# Patient Record
Sex: Female | Born: 1950
Health system: Southern US, Community
[De-identification: ages and names within clinical notes are randomized; demographics above are authoritative.]

## PROBLEM LIST (undated history)

## (undated) DIAGNOSIS — Q859 Phakomatosis, unspecified: Secondary | ICD-10-CM

## (undated) DIAGNOSIS — R740 Nonspecific elevation of levels of transaminase and lactic acid dehydrogenase [LDH]: Secondary | ICD-10-CM

## (undated) DIAGNOSIS — M8580 Other specified disorders of bone density and structure, unspecified site: Secondary | ICD-10-CM

## (undated) DIAGNOSIS — Z5189 Encounter for other specified aftercare: Secondary | ICD-10-CM

## (undated) DIAGNOSIS — K7581 Nonalcoholic steatohepatitis (NASH): Secondary | ICD-10-CM

## (undated) DIAGNOSIS — H269 Unspecified cataract: Secondary | ICD-10-CM

## (undated) DIAGNOSIS — R7989 Other specified abnormal findings of blood chemistry: Secondary | ICD-10-CM

## (undated) DIAGNOSIS — R945 Abnormal results of liver function studies: Secondary | ICD-10-CM

## (undated) DIAGNOSIS — R7303 Prediabetes: Secondary | ICD-10-CM

## (undated) DIAGNOSIS — K8020 Calculus of gallbladder without cholecystitis without obstruction: Secondary | ICD-10-CM

## (undated) DIAGNOSIS — K5790 Diverticulosis of intestine, part unspecified, without perforation or abscess without bleeding: Secondary | ICD-10-CM

## (undated) HISTORY — DX: Abnormal results of liver function studies: R94.5

## (undated) HISTORY — DX: Diverticulosis of intestine, part unspecified, without perforation or abscess without bleeding: K57.90

## (undated) HISTORY — PX: TONSILLECTOMY: SHX5217

## (undated) HISTORY — DX: Prediabetes: R73.03

## (undated) HISTORY — DX: Unspecified cataract: H26.9

## (undated) HISTORY — PX: OOPHORECTOMY: SHX86

## (undated) HISTORY — PX: OTHER SURGICAL HISTORY: SHX169

## (undated) HISTORY — DX: Nonalcoholic steatohepatitis (NASH): K75.81

## (undated) HISTORY — DX: Calculus of gallbladder without cholecystitis without obstruction: K80.20

## (undated) HISTORY — DX: Other specified disorders of bone density and structure, unspecified site: M85.80

## (undated) HISTORY — DX: Encounter for other specified aftercare: Z51.89

## (undated) HISTORY — PX: COLONOSCOPY: SHX174

## (undated) HISTORY — PX: TUBAL LIGATION: SHX77

## (undated) HISTORY — DX: Other specified abnormal findings of blood chemistry: R79.89

## (undated) HISTORY — PX: SALPINGECTOMY: SHX328

## (undated) HISTORY — DX: Nonspecific elevation of levels of transaminase and lactic acid dehydrogenase (ldh): R74.0

---

## 1997-05-10 ENCOUNTER — Other Ambulatory Visit: Admission: RE | Admit: 1997-05-10 | Discharge: 1997-05-10 | Payer: Self-pay | Admitting: Gynecology

## 1998-03-24 ENCOUNTER — Other Ambulatory Visit: Admission: RE | Admit: 1998-03-24 | Discharge: 1998-03-24 | Payer: Self-pay | Admitting: Gynecology

## 2002-01-19 ENCOUNTER — Other Ambulatory Visit: Admission: RE | Admit: 2002-01-19 | Discharge: 2002-01-19 | Payer: Self-pay | Admitting: Obstetrics and Gynecology

## 2003-02-25 ENCOUNTER — Other Ambulatory Visit: Admission: RE | Admit: 2003-02-25 | Discharge: 2003-02-25 | Payer: Self-pay | Admitting: Gynecology

## 2003-03-12 HISTORY — PX: VAGINAL HYSTERECTOMY: SUR661

## 2003-03-22 ENCOUNTER — Observation Stay (HOSPITAL_COMMUNITY): Admission: RE | Admit: 2003-03-22 | Discharge: 2003-03-23 | Payer: Self-pay | Admitting: Obstetrics and Gynecology

## 2003-03-22 ENCOUNTER — Encounter (INDEPENDENT_AMBULATORY_CARE_PROVIDER_SITE_OTHER): Payer: Self-pay | Admitting: Specialist

## 2003-03-22 ENCOUNTER — Encounter (INDEPENDENT_AMBULATORY_CARE_PROVIDER_SITE_OTHER): Payer: Self-pay | Admitting: *Deleted

## 2004-02-06 ENCOUNTER — Ambulatory Visit: Payer: Self-pay | Admitting: Internal Medicine

## 2004-03-23 ENCOUNTER — Other Ambulatory Visit: Admission: RE | Admit: 2004-03-23 | Discharge: 2004-03-23 | Payer: Self-pay | Admitting: Obstetrics and Gynecology

## 2005-03-31 ENCOUNTER — Other Ambulatory Visit: Admission: RE | Admit: 2005-03-31 | Discharge: 2005-03-31 | Payer: Self-pay | Admitting: Obstetrics and Gynecology

## 2005-04-11 ENCOUNTER — Encounter: Payer: Self-pay | Admitting: Internal Medicine

## 2005-04-11 LAB — CONVERTED CEMR LAB

## 2005-05-03 ENCOUNTER — Ambulatory Visit: Payer: Self-pay | Admitting: Internal Medicine

## 2006-08-29 ENCOUNTER — Other Ambulatory Visit: Admission: RE | Admit: 2006-08-29 | Discharge: 2006-08-29 | Payer: Self-pay | Admitting: Obstetrics and Gynecology

## 2006-10-13 ENCOUNTER — Ambulatory Visit: Payer: Self-pay | Admitting: Internal Medicine

## 2006-10-13 DIAGNOSIS — E782 Mixed hyperlipidemia: Secondary | ICD-10-CM | POA: Insufficient documentation

## 2006-10-13 DIAGNOSIS — R9431 Abnormal electrocardiogram [ECG] [EKG]: Secondary | ICD-10-CM | POA: Insufficient documentation

## 2006-10-13 DIAGNOSIS — R002 Palpitations: Secondary | ICD-10-CM | POA: Insufficient documentation

## 2006-10-18 ENCOUNTER — Encounter (INDEPENDENT_AMBULATORY_CARE_PROVIDER_SITE_OTHER): Payer: Self-pay | Admitting: *Deleted

## 2006-10-26 ENCOUNTER — Ambulatory Visit: Payer: Self-pay | Admitting: Internal Medicine

## 2007-01-17 ENCOUNTER — Ambulatory Visit: Payer: Self-pay | Admitting: Internal Medicine

## 2007-01-31 ENCOUNTER — Encounter: Payer: Self-pay | Admitting: Internal Medicine

## 2007-01-31 ENCOUNTER — Ambulatory Visit: Payer: Self-pay | Admitting: Internal Medicine

## 2007-02-02 ENCOUNTER — Encounter: Payer: Self-pay | Admitting: Internal Medicine

## 2007-11-07 ENCOUNTER — Ambulatory Visit: Payer: Self-pay | Admitting: Cardiology

## 2007-11-07 ENCOUNTER — Telehealth (INDEPENDENT_AMBULATORY_CARE_PROVIDER_SITE_OTHER): Payer: Self-pay | Admitting: *Deleted

## 2007-11-07 ENCOUNTER — Observation Stay (HOSPITAL_COMMUNITY): Admission: EM | Admit: 2007-11-07 | Discharge: 2007-11-08 | Payer: Self-pay | Admitting: Emergency Medicine

## 2007-11-22 ENCOUNTER — Ambulatory Visit: Payer: Self-pay

## 2007-11-22 ENCOUNTER — Encounter: Payer: Self-pay | Admitting: Internal Medicine

## 2007-12-05 ENCOUNTER — Encounter: Payer: Self-pay | Admitting: Cardiology

## 2007-12-05 ENCOUNTER — Ambulatory Visit: Payer: Self-pay

## 2007-12-05 ENCOUNTER — Ambulatory Visit: Payer: Self-pay | Admitting: Cardiology

## 2007-12-13 DIAGNOSIS — I059 Rheumatic mitral valve disease, unspecified: Secondary | ICD-10-CM | POA: Insufficient documentation

## 2008-10-21 ENCOUNTER — Ambulatory Visit: Payer: Self-pay | Admitting: Internal Medicine

## 2008-11-11 ENCOUNTER — Ambulatory Visit: Payer: Self-pay | Admitting: Cardiology

## 2008-11-18 LAB — CONVERTED CEMR LAB
HDL: 39.4 mg/dL (ref >39.00)
Total CHOL/HDL Ratio: 4

## 2010-02-08 LAB — CONVERTED CEMR LAB
Free T4: 0.8 ng/dL (ref 0.6–1.6)
Hgb A1c MFr Bld: 5.9 % (ref 4.6–6.0)
LDL Cholesterol: 114 mg/dL — ABNORMAL HIGH (ref 0–99)
Triglycerides: 112 mg/dL (ref 0–149)
VLDL: 22 mg/dL (ref 0–40)

## 2010-05-26 NOTE — Assessment & Plan Note (Signed)
[facility]                            CARDIOLOGY OFFICE NOTE   Stacy Schultz, Stacy Schultz                          MRN:          [id number]  DATE:12/05/2007                            DOB:          [birth year]    PRIMARY CARE PHYSICIAN:  Titus Dubin. Hopper, MD,FACP,FCCP   HISTORY OF PRESENT ILLNESS:  This is a 60 year old with a history of  asbestos exposure, palpitations, and mild dyslipidemia who presents to  Cardiology Clinic for followup after an admission in October for a chest  pain.  The patient was admitted on November 07, 2007, with substernal  chest tightness.  Her cardiac enzymes were negative x3.  D-dimer was  negative.  The patient did have a CT of her chest at that time because  of an abnormality noted on chest x-ray.  This showed bilateral pleural  plaques consistent with asbestos effect as well as a 5-mm lung nodule  and a medial breast nodule.  Since discharge from the hospital, the  patient has had an echocardiogram that showed normal LV function as well  as mitral valve prolapse without significant mitral regurgitation and  exercise Myoview on which she exercised 5 minutes with the EF of 74% and  no perfusion abnormalities.  She additionally had a mammogram and the  breast nodule was noted to be a fluid-filled cyst that was drained.  Since discharge, the patient has had no further chest pain.  She  continues to have episodes of palpitations.  She states that she has had  palpitations since her 17s.  She will have a fast heart rate and  occasionally feel that a beat is skipped.  These episodes occur 1-2  times a week, usually with rest, never with exertion.  She has never had  an episode of syncope or lightheadedness.  She actually does take  propranolol p.r.n. for these palpitation episodes.   MEDICATIONS:  1. Aspirin 81 mg daily.  2. Propranolol p.r.n. palpitations.   PAST MEDICAL HISTORY:  1. Mitral valve prolapse.  The patient had an  echocardiogram done on      December 05, 2007, showing EF of 60%.  There was mitral valve      prolapse without significant mitral regurgitation.  The RV appeared      normal.  2. Mild dyslipidemia.  LDL was 119 in October 2009.  3. Remote ectopic pregnancy.  4. History of  asbestos exposure with pleural plaquing.  5. History of palpitations.  The patient takes propranolol when she      gets these.   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  She lives here in [place].  She has 5  children, all of them are out of the house now.  She does not smoke.  She rarely drinks alcohol.  She does not use any illicit drugs.   FAMILY HISTORY:  The patient's father died of mesothelioma at the age of  52 from asbestos exposure.  The patient's sister had a myocardial  infarction at the age of  58, and the patient's mother has a pacemaker.   REVIEW OF SYSTEMS:  Negative except as noted in the history of present  illness.   LABORATORY DATA:  Most recent from October 2009, HDL 37 and LDL 119.   PHYSICAL EXAMINATION:  VITAL SIGNS:  Blood pressure 120/70, heart rate  is 70 and regular, and weight is 141 pounds.  GENERAL:  This is a well-developed female in no apparent distress.  NEUROLOGIC:  Alert and oriented x3.  Normal affect.  NECK:  There is no JVD.  There is no thyromegaly or thyroid nodule.  LUNGS:  Clear to auscultation bilaterally with normal respiratory  effort.  CARDIOVASCULAR:  Heart regular, S1 and S2.  There is no S3 or S4.  There  is a midsystolic click consistent with mitral valve prolapse.  There is  no murmur.  There is no peripheral edema.  There are 2+ posterior tibial  pulses bilaterally.  There is no carotid bruit.  ABDOMEN:  Soft and nontender.  No  hepatosplenomegaly.  Normal bowel  sounds.  EXTREMITIES:  No clubbing or cyanosis.  MUSCULOSKELETAL:  Normal exam.  HEENT:  Normal exam.  SKIN:  Normal exam.    ASSESSMENT AND PLAN:  This is a 60 year old with mitral valve prolapse  who presents for followup after a recent episode of chest pain.  1. Chest pain.  The patient has had no further episodes.  She did have      negative cardiac enzymes and negative D-dimer.  Exercise Myoview      showed no evidence of ischemia.  Echocardiogram showed normal left      ventricular function.  I do think this is probably non-cardiac      chest pain.  The patient will continue on her aspirin 81 mg a day.      The main benefit from thisfor her probably will be decreased risk      of stroke.  2. Mitral valve prolapse.  The patient does have mitral valve prolapse      noted on echocardiogram done today.  There is no significant mitral      regurgitation.  We will continue to follow this in the future.  She      does not need antibiotic prophylaxis with dental procedures.  We      will have a followup in the office in about a year just to check on      her to see if there is any murmur of mitral regurgitation or any      decrease in her exercise tolerance.  If she is asymptomatic with no      murmur, we will probably follow up an echo in 2-3 years.  3. Dyslipidemia.  The patient's HDL is a little low at 37.  Her LDL is      mildly elevated at 119.  We will continue to follow this and      encourage her to improve her diet and increase her exercise level.      I did tell the patient about trying to walk about 4-5 times a week.      Of note, she only went for 5 minutes on the treadmill with her      exercise Myoview.  However, she states that she was not tired and      they told her she could stop because her heart rate was high      enough.  4. Palpitations.  I will have the  patient do a 2-week event monitor.      She states that she does have the palpitations about 1-2 times a      week, so that we should be able to catch these episodes.  We will      see if she has any clinically relevant arrhythmia.  5. A  5-mm lung nodule.  We will follow up a CT noncontrast of her      chest in next October to make sure that the nodules are not      growing.     Marca Ancona, MD  Electronically Signed    DM/MedQ  DD: 12/05/2007  DT: 12/06/2007  Job #: [id number]   cc:   Titus Dubin. Alwyn Ren, MD,FACP,FCCP

## 2010-05-26 NOTE — Discharge Summary (Signed)
[name], Schultz.:  [id number]   MEDICAL RECORD NO.:  [id number]          PATIENT TYPE:  OBV   LOCATION:  2012                         FACILITY:  [facility]   PHYSICIAN:  Marca Ancona, MD      DATE OF BIRTH:  [birth year]   DATE OF ADMISSION:  11/07/2007  DATE OF DISCHARGE:  11/08/2007                               DISCHARGE SUMMARY   PROCEDURES:  CT of the chest without contrast.   PRIMARY FINAL DISCHARGE DIAGNOSES:  Chest pain, cardiac enzymes negative  for myocardial infarction and outpatient Myoview planned.   SECONDARY DIAGNOSIS:  1. Mitral valve prolapse, outpatient echocardiogram planned.  2. Partially calcified bilateral pleural plaques consistent with prior      asbestos exposure seen on chest x-ray and CT.  3. Lung nodules, measuring up to 5 mm, followup CT in 6-12 month.  4. Medial left breast nodule, recommend correlation with mammogram and      possibly ultrasound.  It is indeterminate by CT.  5. Fatty infiltration of the liver with 1.7 cm medial segment left      liver lobe lesion that is indeterminate.  If left breast workup is      benign, ultrasounds may confirm a simple cyst.  If left breast      workup is malignant, MRI is suggested.  6. Suspect at least one right-sided thyroid nodule, consider      ultrasound.  7. Remote history of ectopic pregnancy.  8. History of an myocardial in one sister secondary to a abnormal      vessel, no stent require.   TIME OF DISCHARGE:  32 minutes.   HOSPITAL COURSE:  [name] is a 60 year old female with no previous  history of coronary artery disease.  She had episodic sharp chest pain  that occurred with deep inspiration.  She came to the hospital and was  admitted for further evaluation.   Her cardiac enzymes were negative for MI.  A lipid profile showed a  total cholesterol 173, triglycerides 85, and HDL 37, LDL 119.  A heart-  healthy diet is recommended.  A D-dimer was also within normal limits  at  0.39.  She was not significantly anemic and had no abnormalities on a  basic metabolic panel.  A chest x-ray showed bilateral a regular  opacities in the mid lung and so a noncontrast CT was performed with  results described above.  Followup is indicated, which can be obtained  through her primary care physician.   On November 08, 2007, [name] was seen by Dr. Shirlee Latch.  Her symptoms had  resolved.  Her O2 saturation was 98% on room air.  She had no  significant arrhythmia.  Dr. Shirlee Latch evaluated [name] and felt she was  stable for discharge with outpatient stress testing and followup  arranged.   DISCHARGE INSTRUCTIONS:  Her activity level is to be increased  gradually.  She is encouraged to stick to a low-sodium heart-healthy  diet.  She is to get a mammogram to follow up the breast nodule and a  repeat CT scan of the chest in 6-12 months.  On November 22, 2007,  at  9:00 a.m., she is to have a stress test with no food or drink after  midnight and no caffeine for 24 hours before.  On November 28, 2007, at  10:30, she is to come to our office for an echocardiogram and see Dr.  Shirlee Latch at 11:30.   DISCHARGE MEDICATIONS:  1. Advil p.r.n.  2. Aspirin 81 mg a day.  3. Propranolol 40 mg p.r.n.      Theodore Demark, PA-C      Marca Ancona, MD  Electronically Signed    RB/MEDQ  D:  11/08/2007  T:  11/08/2007  Job:  [id number]   cc:   Titus Dubin. Alwyn Ren, MD,FACP,FCCP

## 2010-05-26 NOTE — H&P (Signed)
NAMEREDA, CITRON.:  [id number]   MEDICAL RECORD NO.:  [id number]          PATIENT TYPE:  OBV   LOCATION:  2012                         FACILITY:  [facility]   PHYSICIAN:  Marca Ancona, MD      DATE OF BIRTH:  26-Jan-1950   DATE OF ADMISSION:  11/07/2007  DATE OF DISCHARGE:                              HISTORY & PHYSICAL   PRIMARY CARE PHYSICIAN:  Dr. Alwyn Ren.   PRIMARY CARDIOLOGIST:  Marca Ancona, MD   CHIEF COMPLAINT:  Chest pain.   HISTORY OF PRESENT ILLNESS:  [name] is a 60 year old female with no  previous history of coronary artery disease.  For years, she has been  having rare episodes of abdominal tightness that radiated up into her  chest and are associated with palpitations, headache, and fatigue.  She  has always attributed these episodes to her mitral valve prolapse and  they are relieved with p.r.n. medication.  They only have an about twice  a year and she has not had any recently.   Thee days ago, she had episodic chest pain.  It was not exertional.  It  was sharp and each episode would last approximately 2 minutes.  The  intensity was a 4/10.  She had 3 or 4 episodes, but they finally  resolved and she rested well.  Day before yesterday, she had no  symptoms.  Yesterday, she had a chest pain that was not quite as sharp  as the previous chest pain.  It occurred with deep inspiration only.  With each deep breath, she would have this pain which would then  resolve.  She had these all day long, but they finally resolved.  She  had no palpitations or other symptoms.  She rested well.  Today, she had  a headache and some palpitations, but not the other symptoms she has  previously associated with mitral valve prolapse.  She once again  experienced chest pain with deep inspiration, which was recurrent all  morning.  She describes it as a more dull pain and states it reached to  2 or 3/10.  She contacted Dr. Frederik Pear office and they requested  that  she come to the emergency room.  In the emergency room, she is resting  comfortably.  She states her symptoms resolved about 1 p.m. this  afternoon.   PAST MEDICAL HISTORY:  1. Mild dyslipidemia with a total cholesterol of 173, triglycerides      112, HDL 36.9, LDL 114 as of October 2008.  2. History of mitral valve prolapse with no recent echocardiogram.  3. Remote history of stress test reportedly normal, 8-10 years ago.  4. Remote history of ectopic pregnancy.  5. She has no history of diabetes, hypertension, tobacco use, or      obesity.   SURGICAL HISTORY:  She is status post lipoma removal as well as  colonoscopy, tonsillectomy, vaginal hysterectomy with BSO or benign  ovarian tumor.   ALLERGIES:  No known drug allergies.   CURRENT MEDICATIONS:  No prescription medications, only an occasional  Advil.  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  She  has no history of alcohol, tobacco or drug abuse, and does not exercise,  but feels that she eats a healthy diet.   FAMILY HISTORY:  Her mother is alive at age 23 and has a pacemaker, but  no coronary artery disease.  Her father died at age 67 of mesothelioma.  One of her sisters had an MI at age 61 and the patient states that her  sister did not need a stent, but was told she had an MI because of an  abnormality in a vessel in the back of her heart.   REVIEW OF SYSTEMS:  She has not had fevers, chills, or sweats.  She has  had no cough or cold symptoms.  She has had no GI symptoms or reflux.  She has rare arthralgias.  She denies depression or anxiety.  Full 14-  point review of systems is otherwise negative except for the symptoms  described in the HPI.   PHYSICAL EXAM:  VITAL SIGNS:  Temperature is 97.9, blood pressure  154/73, pulse 81, respiratory rate 12, and O2 saturation 98% on room  air.  GENERAL:  She is a  well-developed, well-nourished, white female in no  acute distress.  HEENT:  Normal.  NECK:  There is no lymphadenopathy, thyromegaly, bruit, or JVD noted.  CV:  Her heart is regular in rate and rhythm with an S1 and S2 and no S3  or S4.  There is no murmur or rub noted.  She has a mitral prolapse  click.  Distal pulses are 2+ and no femoral bruits are appreciated.  LUNGS:  Clear to auscultation bilaterally.  SKIN:  No rashes or lesions are noted.  ABDOMEN:  Soft and nontender with active bowel sounds.  No splenomegaly  is noted.  EXTREMITIES:  There is no cyanosis, clubbing, or edema noted.  MUSCULOSKELETAL:  There is no joint deformity or effusions and no spine  or CVA tenderness.  NEUROLOGIC:  She is alert and oriented.  Cranial nerves II through XII  grossly intact.   Chest x-ray shows no acute disease, but a regular opacities over both  mid lungs are noted and should be further evaluated with either a PA and  lateral chest x-ray or chest CT.   EKG is sinus rhythm, rate 84 with 1.5-mm ST depression inferiorly and in  V3 through V5 which is similar to an EKG dated 2008.   LABORATORY VALUES:  Hemoglobin 14.1, hematocrit 42.4, WBC of 6.4,  platelets 197.  Sodium 141, potassium 3.6, chloride 107, CO2 27, BUN 6,  creatinine 0.91, glucose 95, INR 0.9, D-dimer 0.39, point care markers  initially negative.   IMPRESSION:  [name] was seen today by Dr. Shirlee Latch.  She is a 57-year-  old female with a history of mitral valve prolapse who presents with  chest pain off and on for the last 3 days.  She has atypical pleuritic  chest pain.  1. Chest pain.  It is atypical pleuritic.  It is not related to      exertion.  She has minimal risk factors for coronary artery      disease.  There is concern for possible pulmonary embolism if she      sits for long periods of time on a desk; however, her D-dimer is      within normal limits.  She is afebrile and does not feel sick.  Her      white  count is normal.  Her EKG has ST depression, but this is      unchanged from 2008.  She will be admitted and we will cycle      cardiac enzymes.  If her cardiac enzymes are negative, a followup      exercise treadmill Myoview as an outpatient is indicated.  2. Abnormal chest x-ray:  There are two discrete opacities that do not      look like pneumonia.  Since her D-dimer is negative, we will check      a noncontrast CT.  Further evaluation and treatment will depend on      the results of the above testing.  3. Dyslipidemia:  She will be encouraged to follow up with Dr. Alwyn Ren      for her mildly abnormal lipid profile unless she is positive for      coronary artery disease in which case, a statin is indicated.  She      will be started on a baby aspirin and a low-dose beta-blocker.      Theodore Demark, PA-C      Marca Ancona, MD  Electronically Signed    RB/MEDQ  D:  11/07/2007  T:  11/08/2007  Job:  [id number]

## 2010-05-29 NOTE — H&P (Signed)
NAME:  Stacy Schultz, Stacy Schultz                             ACCOUNT NO.:  [id number]   MEDICAL RECORD NO.:  [id number]                   PATIENT TYPE:  OBV   LOCATION:  NA                                   FACILITY:  [facility]   PHYSICIAN:  Daniel L. Eda Paschal, M.D.           DATE OF BIRTH:  1950-10-15   DATE OF ADMISSION:  03/22/2003  DATE OF DISCHARGE:                                HISTORY & PHYSICAL   CHIEF COMPLAINT:  Complex right adnexal mass with postmenopausal bleeding.   HISTORY OF PRESENT ILLNESS:  The patient is 60 year old, gravida 6, para 5,  AB 1, who has been having serial ultrasounds for a variety of abnormalities  in her adnexa.  The last one had shown something on her left adnexa, but  this February when she returned she now has a complex mass on her right  adnexa.  It is approximately 5 cm.  It has multiple septations.  There is an  echogenic area of about 2-3 cm within it.  Her CA125 is normal.  She has  also had several episodes of postmenopausal bleeding and does have an  enlarged endometrial stripe consistent with a polyp.  As a result of all of  this, several options were discussed with her and we have gone ahead and  scheduled laparoscopically assisted vaginal hysterectomy with bilateral  salpingo-oophorectomy to address the adnexal mass and also the  postmenopausal bleeding.  She appreciates all of the issues with the above.  She now enters the hospital for the above.   PAST MEDICAL HISTORY:  1. The patient had an ectopic pregnancy previously and has had a right     salpingectomy.  2. She has also had a lipoma removed.  3. She has also had a T&A.  4. The patient also has mitral valve prolapse.  She has had an abnormal     stress test and therefore had Cardiolite testing twice which was normal.   PRESENT MEDICATIONS:  Nonsteroidal anti-inflammatory drugs.   ALLERGIES:  None.   FAMILY HISTORY:  Her maternal grandmother is diabetic.  She has had maternal  aunts with  coronary artery disease.  Her mother is hypertensive.   SOCIAL HISTORY:  She is a previous smoker.  She no longer drinks alcohol.   REVIEW OF SYSTEMS:  HEENT:  Negative.  CARDIAC:  History of mitral valve  prolapse.  RESPIRATORY:  Basically negative.  GASTROINTESTINAL:  Negative.  MUSCULOSKELETAL:  Negative.  GENITOURINARY:  Negative.  NEUROLOGIC/PSYCHIATRIC:  Negative.  ALLERGIC/IMMUNOLOGIC/LYMPHATIC/ENDOCRINE:  Negative.   PHYSICAL EXAMINATION:  GENERAL APPEARANCE:  The patient is a well-developed,  well-nourished female in no acute distress.  VITAL SIGNS:  The blood pressure is 122/80.  The pulse is 80 and regular.  Respirations 16 and nonlabored.  She is afebrile.  HEENT:  Within normal limits.  NECK:  Supple.  Trachea in the midline.  The thyroid is not enlarged.  LUNGS:  Clear to P&A.  HEART:  No thrills, heaves, or murmurs.  BREASTS:  No masses.  ABDOMEN:  Soft without guarding, rebound, or masses.  PELVIC:  External is normal.  BUS is normal.  Vaginal is normal.  The cervix  is clean.  The last Pap smear showed no atypia.  This was done on February 25, 2003.  The uterus is top normal size and shape.  The right adnexa is  enlarged to about 5 cm.  The left adnexa is negative.  Rectovaginal is  confirmatory.   ADMISSION IMPRESSION:  1. Complex right adnexal mass.  2. Postmenopausal bleeding.   PLAN:  1. Laparoscopically assisted vaginal hysterectomy.  2. Bilateral salpingo-oophorectomy.                                               Daniel L. Eda Paschal, M.D.    Tonette Bihari  D:  03/21/2003  T:  03/21/2003  Job:  [id number]

## 2010-05-29 NOTE — Discharge Summary (Signed)
NAME:  Stacy Schultz, Stacy Schultz                             ACCOUNT NO.:  [id number]   MEDICAL RECORD NO.:  [id number]                   PATIENT TYPE:  OBV   LOCATION:  9303                                 FACILITY:  [facility]   PHYSICIAN:  Daniel L. Eda Paschal, M.D.           DATE OF BIRTH:  [birth year]   DATE OF ADMISSION:  03/22/2003  DATE OF DISCHARGE:  03/23/2003                                 DISCHARGE SUMMARY   PREOPERATIVE DIAGNOSES:  1. Recurrent postmenopausal bleeding.  2. Right adnexal mass.   POSTOPERATIVE DIAGNOSES:  1. Recurrent postmenopausal bleeding.  2. Right ovarian cyst.  3. Right paraovarian cyst.  4. Adhesions right adnexa.  5. Status post laparoscopic-assisted vaginal hysterectomy and bilateral     salpingo-oophorectomy by Dr. Edyth Gunnels on March 22, 2003.   HISTORY:  [name] gravida 6 para 5 aborta 1 who is being  followed with serial ultrasounds for a variety of abnormalities in her  adnexa.  The last one had shown something in her left adnexa but this  February when she returned to have the complex mass in her right adnexa,  approximately 5 cm and multiple septations.  Also an echogenic area about 2-  3 cm within it.  CA125 was normal.  She also had multiple episodes of  postmenopausal bleeding.  Did have an enlarged endometrial stripe consistent  with a polyp and after discussion of all of the above the patient desired  definitive surgery.   HOSPITAL COURSE:  On March 22, 2003 the patient was admitted, underwent a  laparoscopic-assisted vaginal hysterectomy and bilateral salpingo-  oophorectomy and was found in the right adnexa was involved with both a  paraovarian cyst and an ovarian cyst, some adhesions on the right side, left  tube and ovary were normal, and there were no complications.  Postoperatively the patient was admitted just for observation, was  discharged to home on March 23, 2003 in satisfactory condition.   ACCESSORY CLINICAL  FINDINGS/LABORATORY DATA:  Pathology revealed paraovarian  cyst on the right, simple serous.  Cervix showed tunnel clusters, chronic  inflammation, squamous metaplasia, and endocervical polyp.  Endometrium was  proliferative endometrium, no evidence of hyperplasia or malignancy.  Myometrium with superficial adenomyosis.  Serosal surface with no pathologic  diagnosis.  Right ovary was follicular cyst 1.6 cm and left adnexa revealed  the left ovary and fallopian tube with physiologic changes only.  Hemoglobin  on March 20, 2003 was 14.1.   DISPOSITION:  The patient is discharged to home.  She was given Percocet  p.r.n. pain.  She was to follow up with Dr. Edyth Gunnels in 3 weeks.     Susa Loffler, P.A.                    Daniel L. Eda Paschal, M.D.   TSG/MEDQ  D:  04/29/2003  T:  04/30/2003  Job:  [id number]

## 2010-05-29 NOTE — Op Note (Signed)
NAME:  Stacy Schultz, FRIBERG                             ACCOUNT NO.:  [id number]   MEDICAL RECORD NO.:  [id number]                   PATIENT TYPE:  OBV   LOCATION:  9399                                 FACILITY:  [facility]   PHYSICIAN:  Daniel L. Eda Paschal, M.D.           DATE OF BIRTH:  Dec 08, 1950   DATE OF PROCEDURE:  03/22/2003  DATE OF DISCHARGE:                                 OPERATIVE REPORT   PREOPERATIVE DIAGNOSES:  1. Recurrent postmenopausal bleeding.  2. Right adnexal mass.   POSTOPERATIVE DIAGNOSES:  1. Recurrent postmenopausal bleeding.  2. Right ovarian cyst.  3. Right parovarian cyst.  4. Adhesions, right adnexa.   OPERATION:  Laparoscopically-assisted vaginal hysterectomy, bilateral  salpingo-oophorectomy.   SURGEON:  Daniel L. Eda Paschal, M.D.   FIRST ASSISTANT:  Juan H. Lily Peer, M.D.   FINDINGS:  At the time of the surgery the patient's right adnexa was  involved with both a parovarian cyst and an ovarian cyst.  The ovarian cyst  had a non-suspicious appearance.  There were also some adhesions on the  right side, making the ileocecal junction close to the adnexa.  It was  probably felt that the adhesive disease was from her previous tubal  pregnancy on the right.  The left ovary and tube were normal.  Uterus was  normal.  Cul-de-sac was free of any disease.  When the ileocecal junction  was freed up, the patient had a normal appendix.   DESCRIPTION OF PROCEDURE:  After adequate general endotracheal anesthesia,  the patient was placed in the dorsal lithotomy position, prepped and draped  in the usual sterile manner.  A Foley catheter was inserted into her bladder  and a Hulka catheter was turned to the uterus.  A pneumoperitoneum was  created with the Veress needle subumbilically.  That incision was extended  and a 10 mm trocar was placed subumbilically and through that a diagnostic  laparoscope was placed, attached to a camera.  Two 5 mm ports were placed in  the  pelvis in the right and left lower quadrant.  The pelvis was irrigated  with sterile saline, and this was sent for peritoneal washings because of  the right ovarian cyst.  Both ureters could be easily identified.  The  adhesions on the right were freed up with an Endoshears so that the appendix  and bowel were separate from the adnexa.  The parovarian cyst, which was  adherent as well, was freed up and then the infundibulopelvic ligament on  the right was bipolared and cut.  The other attachments of the right adnexa  to the broad ligament were bipolared and cut, as was the round ligament.  The vesicouterine fold of peritoneum was sharply dissected free.  Attention  was next turned to the left side.  The ureter was re-identified.  The  infundibulopelvic ligament was bipolared and cut.  Once again all  attachments of the broad  ligament to the adnexa were bipolared and cut, as  was the round ligament.  The vesicouterine fold of peritoneum was even  further dissected free.  The surgeon then went vaginally.  A 1:200,000  solution of epinephrine and 0.5% Xylocaine was injected around the cervix.  A 360 degree incision was made around the cervix.  The bladder was mobilized  superiorly and the vesicouterine fold of peritoneum and the posterior  peritoneum were entered with sharp dissection.  The uterosacral ligaments  were clamped.  In clamping them, they were shortened and then they were  sutured to the vault laterally for good vault support.  Cardinal ligaments  and uterine arteries were also clamped, cut, and suture ligated.  Chromic  catgut #1 was utilized for suture material.  At this point the uterus,  ovaries, and tubes were free and sent to pathology for tissue diagnosis.  Please note the patient only had a left fallopian tube, having had a  salpingectomy on the right.  The peritoneum was sutured to the cuff with two  running locking 0 Vicryl and a modified McCall enterocele suture was  placed  with 3-0 Vicryl.  The cuff and peritoneum were closed with figure-of-eights  of #1 chromic catgut.  Attention was next turned laparoscopically, and there  was no bleeding noted.  Copious irrigation was done with sterile saline.  All trocars were removed, pneumoperitoneum was evacuated, and the  subumbilical incision was closed through the fascia with 0 Vicryl and the  skin incisions were closed with 3-0 Monocryl.  Estimated blood loss for the  entire procedure was 100 mL with none replaced.  The patient tolerated her  procedure well and left the operating room in satisfactory condition  draining clear urine from her Foley catheter.                                               Daniel L. Eda Paschal, M.D.    Tonette Bihari  D:  03/22/2003  T:  03/23/2003  Job:  [id number]

## 2010-09-28 ENCOUNTER — Encounter: Payer: Self-pay | Admitting: Women's Health

## 2010-09-28 ENCOUNTER — Ambulatory Visit (INDEPENDENT_AMBULATORY_CARE_PROVIDER_SITE_OTHER): Payer: BC Managed Care – PPO | Admitting: Women's Health

## 2010-09-28 ENCOUNTER — Other Ambulatory Visit (HOSPITAL_COMMUNITY)
Admission: RE | Admit: 2010-09-28 | Discharge: 2010-09-28 | Disposition: A | Discharge status: Home / Self Care | Payer: BC Managed Care – PPO | Source: Ambulatory Visit | Attending: Gynecology | Admitting: Gynecology

## 2010-09-28 VITALS — BP 112/64 | Ht 64.0 in | Wt 145.0 lb

## 2010-09-28 DIAGNOSIS — Z780 Asymptomatic menopausal state: Secondary | ICD-10-CM

## 2010-09-28 DIAGNOSIS — L089 Local infection of the skin and subcutaneous tissue, unspecified: Secondary | ICD-10-CM

## 2010-09-28 DIAGNOSIS — Z01419 Encounter for gynecological examination (general) (routine) without abnormal findings: Secondary | ICD-10-CM | POA: Insufficient documentation

## 2010-09-28 DIAGNOSIS — I341 Nonrheumatic mitral (valve) prolapse: Secondary | ICD-10-CM | POA: Insufficient documentation

## 2010-09-28 DIAGNOSIS — Z833 Family history of diabetes mellitus: Secondary | ICD-10-CM

## 2010-09-28 MED ORDER — SULFAMETHOXAZOLE-TRIMETHOPRIM 800-160 MG PO TABS: 1.0000 | ORAL_TABLET | Freq: Two times a day (BID) | ORAL | Status: AC

## 2010-09-28 NOTE — Progress Notes (Signed)
Stacy Schultz 03/08/1950 [id number]    History:    The patient presents for annual exam.  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     Past medical history, past surgical history, family history and social history were all reviewed and documented in the EPIC chart.   ROS:  A  ROS was performed and pertinent positives and negatives are included in the history.  Exam:  Filed Vitals:   09/28/10 1027  BP: 112/64    General appearance:  Normal Head/Neck:  Normal, without cervical or supraclavicular adenopathy. Thyroid:  Symmetrical, normal in size, without palpable masses or nodularity. Respiratory  Effort:  Normal  Auscultation:  Clear without wheezing or rhonchi Cardiovascular  Auscultation:  Regular rate, without rubs, murmurs or gallops  Edema/varicosities:  Not grossly evident Abdominal  Soft,nontender, without masses, guarding or rebound.  Liver/spleen:  No organomegaly noted  Hernia:  None appreciated  Skin  Inspection:  Grossly normal  Palpation:  Grossly normal Neurologic/psychiatric  Orientation:  Normal with appropriate conversation.  Mood/affect:  Normal  Genitourinary    Breasts: Examined lying and sitting.     Right: Without masses, retractions, discharge or axillary adenopathy.     Left: Without masses, retractions, discharge or axillary adenopathy.   Inguinal/mons:  Normal without inguinal adenopathy  External genitalia:  Normal  BUS/Urethra/Skene's glands:  Normal  Bladder:  Normal  Vagina:  Normal  Cervix:  Absent  Uterus:  absent  Adnexa/parametria:     Rt: Without masses or tenderness.   Lt: Without masses or tenderness.  Anus and perineum: Normal  Digital rectal exam: Normal sphincter tone without palpated masses or tenderness  Assessment/Plan:  60 y.o. DWF G6P5 4 children living, 1 daughter died at age 62 from endocarditis. Annual exam history of LAVH BSO for recurrent post menopausal bleeding in 05, not sexually active, with  no complaints.  Had normal colonoscopy in 08, last dexa in 08 with mild osteopenia, will repeat this year.  SBEs, normal mammogram annually, due in November .  Encouraged calcium rich diet, home safety/fall prevention. Zostovac, Tdap, flu vaccines encouraged.  CBC, Glu, UA, Pap.  Will check FLP.  Last OV in 08, encouraged annual visit.     Harrington Challenger [facility], 1:07 PM 09/28/2010

## 2010-09-28 NOTE — Patient Instructions (Signed)
Zostovac Tdap   Vaccines Flu vaccine  Dexa

## 2010-10-13 LAB — DIFFERENTIAL
Basophils Absolute: 0
Eosinophils Absolute: 0.1
Eosinophils Relative: 2
Monocytes Absolute: 0.4
Neutro Abs: 3.9
Neutrophils Relative %: 62

## 2010-10-13 LAB — BASIC METABOLIC PANEL
BUN: 6
Creatinine, Ser: 0.91
GFR calc Af Amer: >60
GFR calc non Af Amer: >60
Glucose, Bld: 95
Sodium: 141

## 2010-10-13 LAB — POCT CARDIAC MARKERS
CKMB, poc: <1 — ABNORMAL LOW
Myoglobin, poc: 57.2
Myoglobin, poc: 75.9
Troponin i, poc: <0.05
Troponin i, poc: <0.05

## 2010-10-13 LAB — TROPONIN I: Troponin I: <0.01

## 2010-10-13 LAB — D-DIMER, QUANTITATIVE: D-Dimer, Quant: 0.39

## 2010-10-13 LAB — CBC
Hemoglobin: 14.1
MCV: 88.2
RBC: 4.81

## 2010-10-13 LAB — CARDIAC PANEL(CRET KIN+CKTOT+MB+TROPI)
Relative Index: INVALID
Total CK: 82
Troponin I: 0.01

## 2010-10-13 LAB — LIPID PANEL
Total CHOL/HDL Ratio: 4.7
Triglycerides: 85

## 2010-10-13 LAB — PROTIME-INR: Prothrombin Time: 12.3

## 2010-10-13 LAB — CK TOTAL AND CKMB (NOT AT ARMC): Relative Index: INVALID

## 2010-11-26 ENCOUNTER — Other Ambulatory Visit: Payer: Self-pay | Admitting: Obstetrics and Gynecology

## 2010-11-26 DIAGNOSIS — Z13820 Encounter for screening for osteoporosis: Secondary | ICD-10-CM

## 2010-12-08 ENCOUNTER — Encounter: Payer: Self-pay | Admitting: Obstetrics and Gynecology

## 2010-12-08 ENCOUNTER — Ambulatory Visit (INDEPENDENT_AMBULATORY_CARE_PROVIDER_SITE_OTHER): Payer: BC Managed Care – PPO

## 2010-12-08 DIAGNOSIS — Z13820 Encounter for screening for osteoporosis: Secondary | ICD-10-CM

## 2010-12-15 ENCOUNTER — Encounter: Payer: Self-pay | Admitting: Gynecology

## 2010-12-15 ENCOUNTER — Encounter: Payer: Self-pay | Admitting: Obstetrics and Gynecology

## 2011-05-22 ENCOUNTER — Encounter (HOSPITAL_COMMUNITY): Payer: Self-pay | Admitting: Emergency Medicine

## 2011-05-22 ENCOUNTER — Emergency Department (HOSPITAL_COMMUNITY): Payer: BC Managed Care – PPO

## 2011-05-22 ENCOUNTER — Observation Stay (HOSPITAL_COMMUNITY)
Admission: EM | Admit: 2011-05-22 | Discharge: 2011-05-24 | Disposition: A | Discharge status: Home / Self Care | Payer: BC Managed Care – PPO | Attending: Internal Medicine | Admitting: Internal Medicine

## 2011-05-22 DIAGNOSIS — J069 Acute upper respiratory infection, unspecified: Secondary | ICD-10-CM

## 2011-05-22 DIAGNOSIS — K8020 Calculus of gallbladder without cholecystitis without obstruction: Secondary | ICD-10-CM | POA: Insufficient documentation

## 2011-05-22 DIAGNOSIS — R05 Cough: Secondary | ICD-10-CM

## 2011-05-22 DIAGNOSIS — I341 Nonrheumatic mitral (valve) prolapse: Secondary | ICD-10-CM

## 2011-05-22 DIAGNOSIS — I059 Rheumatic mitral valve disease, unspecified: Secondary | ICD-10-CM | POA: Diagnosis present

## 2011-05-22 DIAGNOSIS — N1330 Unspecified hydronephrosis: Secondary | ICD-10-CM

## 2011-05-22 DIAGNOSIS — I10 Essential (primary) hypertension: Secondary | ICD-10-CM | POA: Insufficient documentation

## 2011-05-22 DIAGNOSIS — E782 Mixed hyperlipidemia: Secondary | ICD-10-CM

## 2011-05-22 DIAGNOSIS — R0789 Other chest pain: Secondary | ICD-10-CM | POA: Insufficient documentation

## 2011-05-22 DIAGNOSIS — R059 Cough, unspecified: Secondary | ICD-10-CM

## 2011-05-22 DIAGNOSIS — E785 Hyperlipidemia, unspecified: Secondary | ICD-10-CM | POA: Insufficient documentation

## 2011-05-22 DIAGNOSIS — R002 Palpitations: Secondary | ICD-10-CM | POA: Insufficient documentation

## 2011-05-22 DIAGNOSIS — R7402 Elevation of levels of lactic acid dehydrogenase (LDH): Secondary | ICD-10-CM | POA: Insufficient documentation

## 2011-05-22 DIAGNOSIS — K7689 Other specified diseases of liver: Secondary | ICD-10-CM | POA: Insufficient documentation

## 2011-05-22 DIAGNOSIS — R7401 Elevation of levels of liver transaminase levels: Secondary | ICD-10-CM

## 2011-05-22 DIAGNOSIS — R9431 Abnormal electrocardiogram [ECG] [EKG]: Secondary | ICD-10-CM | POA: Insufficient documentation

## 2011-05-22 DIAGNOSIS — R0602 Shortness of breath: Principal | ICD-10-CM

## 2011-05-22 DIAGNOSIS — E876 Hypokalemia: Secondary | ICD-10-CM | POA: Insufficient documentation

## 2011-05-22 DIAGNOSIS — E042 Nontoxic multinodular goiter: Secondary | ICD-10-CM | POA: Insufficient documentation

## 2011-05-22 DIAGNOSIS — R6889 Other general symptoms and signs: Secondary | ICD-10-CM | POA: Insufficient documentation

## 2011-05-22 HISTORY — DX: Phakomatosis, unspecified: Q85.9

## 2011-05-22 LAB — URINE MICROSCOPIC-ADD ON

## 2011-05-22 LAB — URINALYSIS, ROUTINE W REFLEX MICROSCOPIC
Glucose, UA: NEGATIVE mg/dL
Hgb urine dipstick: NEGATIVE
Protein, ur: NEGATIVE mg/dL
Specific Gravity, Urine: 1.015 (ref 1.005–1.030)
pH: 6.5 (ref 5.0–8.0)

## 2011-05-22 LAB — CBC
HCT: 46.5 % — ABNORMAL HIGH (ref 36.0–46.0)
MCH: 29.3 pg (ref 26.0–34.0)
MCHC: 34.6 g/dL (ref 30.0–36.0)
RDW: 12.9 % (ref 11.5–15.5)

## 2011-05-22 LAB — TROPONIN I: Troponin I: <0.3 ng/mL (ref <0.30)

## 2011-05-22 LAB — COMPREHENSIVE METABOLIC PANEL
AST: 146 U/L — ABNORMAL HIGH (ref 0–37)
Albumin: 4.1 g/dL (ref 3.5–5.2)
BUN: 9 mg/dL (ref 6–23)
Calcium: 9.3 mg/dL (ref 8.4–10.5)
Chloride: 98 mEq/L (ref 96–112)
Creatinine, Ser: 0.89 mg/dL (ref 0.50–1.10)
Total Protein: 8 g/dL (ref 6.0–8.3)

## 2011-05-22 LAB — DIFFERENTIAL
Basophils Absolute: 0 10*3/uL (ref 0.0–0.1)
Basophils Relative: 0 % (ref 0–1)
Eosinophils Absolute: 0 10*3/uL (ref 0.0–0.7)
Eosinophils Relative: 0 % (ref 0–5)
Monocytes Absolute: 0.5 10*3/uL (ref 0.1–1.0)
Monocytes Relative: 10 % (ref 3–12)
Neutro Abs: 3.8 10*3/uL (ref 1.7–7.7)

## 2011-05-22 LAB — D-DIMER, QUANTITATIVE: D-Dimer, Quant: 0.8 ug/mL-FEU — ABNORMAL HIGH (ref 0.00–0.48)

## 2011-05-22 MED ORDER — IOHEXOL 300 MG/ML  SOLN
100.0000 mL | Freq: Once | INTRAMUSCULAR | Status: AC | PRN
  Administered 2011-05-22: 100 mL via INTRAVENOUS

## 2011-05-22 MED ORDER — ASPIRIN 81 MG PO CHEW
324.0000 mg | CHEWABLE_TABLET | Freq: Once | ORAL | Status: AC
  Administered 2011-05-22: 324 mg via ORAL
  Filled 2011-05-22: qty 3
  Filled 2011-05-22: qty 4

## 2011-05-22 NOTE — ED Notes (Signed)
Patient transported to CT 

## 2011-05-22 NOTE — ED Notes (Signed)
Pt c/o cough, increased HR, and difficulty breathing onset 3 days ago, worse today

## 2011-05-22 NOTE — ED Provider Notes (Signed)
History     CSN: [id number]  Arrival date & time 05/22/11  2036   First MD Initiated Contact with Patient 05/22/11 2114      Chief Complaint  Patient presents with  . Shortness of Breath   HPI  History provided by the patient. Patient is a 61 year old female with history of MVP who presents with complaints of cough and shortness of breath that began 3 days ago. Patient reports cough began first for the first 1-2 days with increase shortness of breath. Patient also reports feeling that her heart is pounding and racing in chest with slight discomfort. She denies significant chest pain. She denies pleuritic chest pains or hemoptysis. Patient denies having similar symptoms previously. Cough has been occasionally productive but mostly dry. She denies any fever, chills, sweats. She denies any nasal congestion, rhinorrhea sore throat. Patient denies any abdominal pain, nausea or vomiting. Patient denies any diaphoresis. Patient denies any recent long travel, history of cancer, history of recent surgery, history of DVT or PE, history estrogen use.    Past Medical History  Diagnosis Date  . MVP (mitral valve prolapse)     PROPHYLACTIC ANTIBIOTICS    Past Surgical History  Procedure Date  . Salpingectomy     RIGHT/FOR ECTOPIC PREGNANCY  . Excision of lipoma   . Tonsillectomy   . Oophorectomy   . Vaginal hysterectomy 03/2003    LAVH, BSO    Family History  Problem Relation Age of Onset  . Heart disease Daughter     DIED AT 43Y0 WITH ENDOCARDITIS  . Diabetes Mother   . Cancer Father     Mesothelioma  . Diabetes Maternal Aunt   . Ovarian cancer Maternal Aunt   . Heart failure Maternal Grandmother   . Hypertension Maternal Grandmother   . Thyroid disease Maternal Grandmother     History  Substance Use Topics  . Smoking status: Never Smoker   . Smokeless tobacco: Not on file  . Alcohol Use: No    OB History    Grav Para Term Preterm Abortions TAB SAB Ect Mult Living   6 5 5   1   1  4       Review of Systems  Constitutional: Negative for fever and chills.  Respiratory: Positive for cough and shortness of breath.   Cardiovascular: Positive for palpitations.  Gastrointestinal: Negative for nausea, vomiting and abdominal pain.  Genitourinary: Negative for dysuria.  Neurological: Negative for dizziness, syncope and light-headedness.    Allergies  Review of patient's allergies indicates no known allergies.  Home Medications   Current Outpatient Rx  Name Route Sig Dispense Refill  . GUAIFENESIN ER 600 MG PO TB12 Oral Take 600 mg by mouth.    . IBUPROFEN 200 MG PO CAPS Oral Take 400 mg by mouth every 6 (six) hours as needed. Pain.      BP 110/72  Pulse 98  Temp(Src) 98.4 F (36.9 C) (Oral)  Resp 20  Ht 5\' 4"  (1.626 m)  Wt 140 lb (63.504 kg)  BMI 24.03 kg/m2  SpO2 99%  Physical Exam  Nursing note and vitals reviewed. Constitutional: She is oriented to person, place, and time. She appears well-developed and well-nourished. No distress.  HENT:  Head: Normocephalic.  Cardiovascular: Normal rate and regular rhythm.   No murmur heard. Pulmonary/Chest: Effort normal and breath sounds normal. No respiratory distress. She has no wheezes. She has no rales.  Abdominal: Soft. There is no tenderness. There is no rebound and no guarding.  Musculoskeletal: She exhibits no edema and no tenderness.  Neurological: She is alert and oriented to person, place, and time.  Skin: Skin is warm and dry. No rash noted.  Psychiatric: She has a normal mood and affect. Her behavior is normal.    ED Course  Procedures   Labs Reviewed  TROPONIN I  CBC  DIFFERENTIAL  COMPREHENSIVE METABOLIC PANEL  URINALYSIS, ROUTINE W REFLEX MICROSCOPIC   Dg Chest 2 View  05/22/2011  *RADIOLOGY REPORT*  Clinical Data: Shortness of breath, cough and congestion.  CHEST - 2 VIEW  Comparison: CT chest 10/21/2008 and plain film chest 11/07/2007.  Findings: Calcified pleural plaques are  again seen.  Lungs are clear.  Heart size is normal.  No pneumothorax or pleural effusion. Heart size is normal.  IMPRESSION: No acute finding.  Stable compared to prior exam.  Original Report Authenticated By: Bernadene Bell. Maricela Curet, M.D.   Ct Angio Chest W/cm &/or Wo Cm  05/23/2011  *RADIOLOGY REPORT*  Clinical Data: Cough, increased heart rate, and difficulty breathing for 3 days.  D-dimer 0.8.  CT ANGIOGRAPHY CHEST  Technique:  Multidetector CT imaging of the chest using the standard protocol during bolus administration of intravenous contrast. Multiplanar reconstructed images including MIPs were obtained and reviewed to evaluate the vascular anatomy.  Contrast: OMNIPAQUE IOHEXOL 300 MG/ML  SOLN  Comparison: 10/21/2008  Findings: Technically adequate study with good opacification of the central and segmental pulmonary arteries.  No focal filling defect. No evidence of significant pulmonary embolus.  Normal caliber thoracic aorta with mild calcification.  Normal heart size.  Esophagus is decompressed.  No significant lymphadenopathy in the chest.  Nodular enlargement of the right thyroid gland appears stable.  No pleural effusion.  Cyst in the medial segment left lobe of the liver stable since previous study.  Bilateral calcified pleural plaques.  This may be correlated with previous asbestos exposure.  Mild dependent changes in the lung bases.  Atelectasis in the left lung base.  No focal consolidation or interstitial disease.  Airways appear patent.  No pneumothorax. Degenerative changes in the thoracic spine.  Calcified granulomas in the lungs.  IMPRESSION: No evidence of significant pulmonary embolus.  Otherwise stable appearance of the chest with calcified pleural plaques and calcified granulomas bilaterally.  Stable appearance of hepatic cyst and right thyroid nodules.  Atelectasis in the left lung base.  Original Report Authenticated By: Marlon Pel, M.D.   US Abdomen Complete  05/23/2011   *RADIOLOGY REPORT*  Clinical Data:  Elevated liver transaminases and right upper quadrant abdominal pain.  ABDOMEN ULTRASOUND  Technique:  Complete abdominal ultrasound examination was performed including evaluation of the liver, gallbladder, bile ducts, pancreas, kidneys, spleen, IVC, and abdominal aorta.  Comparison:  CT of chest dated 05/22/2011  Findings:  Gallbladder:  Numerous shadowing gallstones are present in the gallbladder.  There is no evidence of gallbladder wall dilatation, gallbladder wall thickening, pericholecystic fluid or sonographic Murphy's sign.  Common Bile Duct:  Normal caliber of 3 mm.  Liver:  Cystic structure in the medial segment of the left lobe of the liver measures 2.4 x 1.9 x 2.2 cm.  This has the appearance of a cyst of the liver.  This was also previously seen by CT.  The liver itself shows mild and diffuse increased echogenicity, likely reflecting steatosis.  No solid mass or intrahepatic biliary dilatation identified by ultrasound.  The portal vein is patent.  IVC:  Patent throughout its visualized course in the abdomen.  Pancreas:  Although the pancreas is difficult to visualize in its entirety, no focal pancreatic abnormality is identified.  Spleen:  The spleen is of normal echotexture and size.  Kidneys:  The left kidney measures 11.1 cm and demonstrates evidence of moderate hydronephrosis.  Etiology is not determined by ultrasound.  No evidence of focal mass of the left kidney.  The right kidney measures 9.9 cm and has a normal sonographic appearance.  Abdominal Aorta:  Normal caliber abdominal aorta.  IMPRESSION:  1.  Cholelithiasis with numerous gallstones present.  No evidence of biliary obstruction or signs of cholecystitis by ultrasound. 2.  Left hydronephrosis of unclear etiology.  Recommend further abdominal and pelvic CT evaluation with contrast including delayed renal and pelvic imaging to evaluate for renal excretion and potential ureteral or bladder pathology. 3.   Echogenic liver likely reflecting steatosis.  Left lobe hepatic cyst.  Original Report Authenticated By: Reola Calkins, M.D.     1. Shortness of breath   2. Cough   3. Abnormal EKG       MDM  9:05 PM patient seen and evaluated. Patient no acute distress.   Patient seen and discussed with attending physician. There is concerned with the EKG change. No ST elevation this time. First troponin negative. She was slightly elevated d-dimer. Will obtain CT imaging.  CT without signs for PE. We'll plan for admission based on abnormal.  Tried hospitalist. They will see patient and admit.     Date: 05/22/2011  Rate: 95  Rhythm: normal sinus rhythm  QRS Axis: normal  Intervals: normal  ST/T Wave abnormalities: ST depressions inferiorly and ST depressions laterally  Conduction Disutrbances:none  Narrative Interpretation:   Old EKG Reviewed: changes noted from 11/08/2007          Angus Seller, PA 05/23/11 2051

## 2011-05-23 ENCOUNTER — Observation Stay (HOSPITAL_COMMUNITY): Payer: BC Managed Care – PPO

## 2011-05-23 ENCOUNTER — Telehealth: Payer: Self-pay | Admitting: Cardiology

## 2011-05-23 ENCOUNTER — Encounter (HOSPITAL_COMMUNITY): Payer: Self-pay | Admitting: Cardiology

## 2011-05-23 DIAGNOSIS — R7401 Elevation of levels of liver transaminase levels: Secondary | ICD-10-CM

## 2011-05-23 DIAGNOSIS — J069 Acute upper respiratory infection, unspecified: Secondary | ICD-10-CM

## 2011-05-23 DIAGNOSIS — R0602 Shortness of breath: Secondary | ICD-10-CM

## 2011-05-23 DIAGNOSIS — I10 Essential (primary) hypertension: Secondary | ICD-10-CM

## 2011-05-23 DIAGNOSIS — R9431 Abnormal electrocardiogram [ECG] [EKG]: Secondary | ICD-10-CM

## 2011-05-23 DIAGNOSIS — R079 Chest pain, unspecified: Secondary | ICD-10-CM

## 2011-05-23 DIAGNOSIS — N1330 Unspecified hydronephrosis: Secondary | ICD-10-CM

## 2011-05-23 DIAGNOSIS — E782 Mixed hyperlipidemia: Secondary | ICD-10-CM

## 2011-05-23 HISTORY — DX: Elevation of levels of liver transaminase levels: R74.01

## 2011-05-23 LAB — COMPREHENSIVE METABOLIC PANEL
ALT: 130 U/L — ABNORMAL HIGH (ref 0–35)
AST: 125 U/L — ABNORMAL HIGH (ref 0–37)
Albumin: 3.4 g/dL — ABNORMAL LOW (ref 3.5–5.2)
CO2: 23 mEq/L (ref 19–32)
Calcium: 9.1 mg/dL (ref 8.4–10.5)
Creatinine, Ser: 0.8 mg/dL (ref 0.50–1.10)
GFR calc Af Amer: 90 mL/min — ABNORMAL LOW (ref >90)
Sodium: 134 mEq/L — ABNORMAL LOW (ref 135–145)
Total Protein: 7 g/dL (ref 6.0–8.3)

## 2011-05-23 LAB — HEMOGLOBIN A1C: Mean Plasma Glucose: 131 mg/dL — ABNORMAL HIGH (ref <117)

## 2011-05-23 LAB — CARDIAC PANEL(CRET KIN+CKTOT+MB+TROPI)
CK, MB: 1.3 ng/mL (ref 0.3–4.0)
CK, MB: 1.2 ng/mL (ref 0.3–4.0)
Relative Index: INVALID (ref 0.0–2.5)
Relative Index: INVALID (ref 0.0–2.5)
Total CK: 98 U/L (ref 7–177)
Total CK: 91 U/L (ref 7–177)
Total CK: 93 U/L (ref 7–177)
Troponin I: <0.3 ng/mL (ref <0.30)

## 2011-05-23 LAB — RAPID URINE DRUG SCREEN, HOSP PERFORMED
Amphetamines: NOT DETECTED
Benzodiazepines: NOT DETECTED
Opiates: NOT DETECTED

## 2011-05-23 LAB — HEPATITIS PANEL, ACUTE
HCV Ab: NEGATIVE
Hep A IgM: NEGATIVE

## 2011-05-23 LAB — CBC
Platelets: 148 10*3/uL — ABNORMAL LOW (ref 150–400)
RDW: 13.1 % (ref 11.5–15.5)
WBC: 3.8 10*3/uL — ABNORMAL LOW (ref 4.0–10.5)

## 2011-05-23 LAB — LIPID PANEL
HDL: 30 mg/dL — ABNORMAL LOW (ref >39)
LDL Cholesterol: 64 mg/dL (ref 0–99)

## 2011-05-23 LAB — GLUCOSE, CAPILLARY: Glucose-Capillary: 103 mg/dL — ABNORMAL HIGH (ref 70–99)

## 2011-05-23 LAB — TSH: TSH: 1.982 u[IU]/mL (ref 0.350–4.500)

## 2011-05-23 LAB — PHOSPHORUS: Phosphorus: 3.4 mg/dL (ref 2.3–4.6)

## 2011-05-23 MED ORDER — ACETAMINOPHEN 325 MG PO TABS
650.0000 mg | ORAL_TABLET | Freq: Once | ORAL | Status: AC
  Administered 2011-05-23: 650 mg via ORAL
  Filled 2011-05-23: qty 2

## 2011-05-23 MED ORDER — POTASSIUM CHLORIDE CRYS ER 20 MEQ PO TBCR: 40.0000 meq | EXTENDED_RELEASE_TABLET | Freq: Once | ORAL | Status: DC

## 2011-05-23 MED ORDER — HYDROCOD POLST-CHLORPHEN POLST 10-8 MG/5ML PO LQCR
5.0000 mL | Freq: Two times a day (BID) | ORAL | Status: DC | PRN
  Administered 2011-05-23: 1.25 mL via ORAL
  Administered 2011-05-23: 5 mL via ORAL
  Filled 2011-05-23 (×2): qty 5

## 2011-05-23 MED ORDER — SODIUM CHLORIDE 0.9 % IV SOLN
INTRAVENOUS | Status: AC
  Administered 2011-05-23: 02:00:00 via INTRAVENOUS

## 2011-05-23 MED ORDER — GUAIFENESIN ER 600 MG PO TB12
1200.0000 mg | ORAL_TABLET | Freq: Two times a day (BID) | ORAL | Status: DC
  Administered 2011-05-23 – 2011-05-24 (×3): 1200 mg via ORAL
  Filled 2011-05-23 (×4): qty 2

## 2011-05-23 MED ORDER — MORPHINE SULFATE 2 MG/ML IJ SOLN: 1.0000 mg | INTRAMUSCULAR | Status: DC | PRN

## 2011-05-23 MED ORDER — ENOXAPARIN SODIUM 40 MG/0.4ML ~~LOC~~ SOLN
40.0000 mg | SUBCUTANEOUS | Status: DC
  Administered 2011-05-23 – 2011-05-24 (×2): 40 mg via SUBCUTANEOUS
  Filled 2011-05-23 (×2): qty 0.4

## 2011-05-23 MED ORDER — ONDANSETRON HCL 4 MG PO TABS: 4.0000 mg | ORAL_TABLET | Freq: Four times a day (QID) | ORAL | Status: DC | PRN

## 2011-05-23 MED ORDER — ONDANSETRON HCL 4 MG/2ML IJ SOLN
4.0000 mg | Freq: Four times a day (QID) | INTRAMUSCULAR | Status: DC | PRN
  Administered 2011-05-23: 4 mg via INTRAVENOUS
  Filled 2011-05-23: qty 2

## 2011-05-23 MED ORDER — ASPIRIN EC 81 MG PO TBEC
81.0000 mg | DELAYED_RELEASE_TABLET | Freq: Every day | ORAL | Status: DC
  Administered 2011-05-23 – 2011-05-24 (×2): 81 mg via ORAL
  Filled 2011-05-23 (×2): qty 1

## 2011-05-23 MED ORDER — SODIUM CHLORIDE 0.9 % IJ SOLN
3.0000 mL | Freq: Two times a day (BID) | INTRAMUSCULAR | Status: DC
  Administered 2011-05-23 – 2011-05-24 (×3): 3 mL via INTRAVENOUS

## 2011-05-23 MED ORDER — HYDROCODONE-ACETAMINOPHEN 5-325 MG PO TABS: 1.0000 | ORAL_TABLET | ORAL | Status: DC | PRN

## 2011-05-23 MED ORDER — DEXTROSE 5 % IV SOLN
1.0000 g | INTRAVENOUS | Status: DC
  Administered 2011-05-24: 1 g via INTRAVENOUS
  Filled 2011-05-23: qty 10

## 2011-05-23 NOTE — Progress Notes (Signed)
Pt c/o cough X 5 days. Non-productive.  MD -- please order something for cough.

## 2011-05-23 NOTE — H&P (Addendum)
PCP:  Marga Melnick, MD, MD   DOA:  05/22/2011  8:36 PM  Chief Complaint:  Shortness of breath with pressure around the throat area  HPI: Patient is a 61 year old female with history of MVP who presents with main concern of progressively worsening pressure around the throat area that initially started 2-3 days prior to admission and is associated with nonproductive cough (occasionally slightly productive but mostly dry) and shortness of breath. She describes occasional heaviness in her chest, non radiating, but not "real chest pain". She denies any specific aggravating or alleviating factors and denies similar episodes in the past. She denies any nasal congestion, rhinorrhea sore throat. She also denies any abdominal pain, nausea or vomiting, diaphoresis, recent long travel, history of cancer, history of recent surgery, history of DVT or PE, history estrogen use.  Allergies: No Known Allergies  Prior to Admission medications   Medication Sig Start Date End Date Taking? Authorizing Provider  guaiFENesin (MUCINEX) 600 MG 12 hr tablet Take 600 mg by mouth.   Yes Historical Provider, MD  Ibuprofen (ADVIL) 200 MG CAPS Take 400 mg by mouth every 6 (six) hours as needed. Pain.   Yes Historical Provider, MD    Past Medical History  Diagnosis Date  . MVP (mitral valve prolapse)     PROPHYLACTIC ANTIBIOTICS    Past Surgical History  Procedure Date  . Salpingectomy     RIGHT/FOR ECTOPIC PREGNANCY  . Excision of lipoma   . Tonsillectomy   . Oophorectomy   . Vaginal hysterectomy 03/2003    LAVH, BSO    Social History:  reports that she has never smoked. She does not have any smokeless tobacco history on file. She reports that she does not drink alcohol or use illicit drugs.  Family History  Problem Relation Age of Onset  . Heart disease Daughter     DIED AT 51Y0 WITH ENDOCARDITIS  . Diabetes Mother   . Cancer Father     Mesothelioma  . Diabetes Maternal Aunt   . Ovarian cancer  Maternal Aunt   . Heart failure Maternal Grandmother   . Hypertension Maternal Grandmother   . Thyroid disease Maternal Grandmother     Review of Systems:  Per HPI  Physical Exam:  Filed Vitals:   05/22/11 2049 05/22/11 2357  BP: 110/72 128/60  Pulse: 98 75  Temp: 98.4 F (36.9 C) 98.2 F (36.8 C)  TempSrc: Oral Oral  Resp: 20 19  Height: 5\' 4"  (1.626 m)   Weight: 63.504 kg (140 lb)   SpO2: 99% 99%    Constitutional: Vital signs reviewed.  Patient is in no acute distress and cooperative with exam. Alert and oriented x3.  Head: Normocephalic and atraumatic Ear: TM normal bilaterally Mouth: no erythema or exudates, MMM Eyes: PERRL, EOMI, conjunctivae normal, No scleral icterus.  Neck: Supple, Trachea midline normal ROM, No JVD, mass, thyromegaly, or carotid bruit present.  Cardiovascular: RRR, S1 normal, S2 normal, no MRG, pulses symmetric and intact bilaterally Pulmonary/Chest: CTAB but slightly decreased at bases, no wheezes, rales, or rhonchi Abdominal: Soft. Epigastric area tender, right > left, non-distended, bowel sounds are normal, no masses, organomegaly, or guarding present.  GU: no CVA tenderness Musculoskeletal: No joint deformities, erythema, or stiffness, ROM full and no nontender Ext: no edema and no cyanosis, pulses palpable bilaterally (DP and PT) Hematology: no cervical, inginal, or axillary adenopathy.  Neurological: A&O x3, Strenght is normal and symmetric bilaterally, cranial nerve II-XII are grossly intact, no focal motor deficit, sensory  intact to light touch bilaterally.  Skin: Warm, dry and intact. No rash, cyanosis, or clubbing.  Psychiatric: Normal mood and affect. speech and behavior is normal. Judgment and thought content normal. Cognition and memory are normal.   Labs on Admission:      Component Value Range   Troponin I <0.30  <0.30 (ng/mL)      Component Value Range   WBC 5.2  4.0 - 10.5 (K/uL)   Hemoglobin 16.1  12.0 - 15.0 (g/dL)   HCT  11.9  14.7 - 82.9 (%)   MCV 84.7  78.0 - 100.0 (fL)   Platelets 177  150 - 400 (K/uL)      Component Value Range   Sodium 135  135 - 145 (mEq/L)   Potassium 3.3 (*) 3.5 - 5.1 (mEq/L)   Chloride 98  96 - 112 (mEq/L)   CO2 23  19 - 32 (mEq/L)   Glucose, Bld 134 (*) 70 - 99 (mg/dL)   BUN 9  6 - 23 (mg/dL)   Creatinine, Ser 5.62  0.50 - 1.10 (mg/dL)   Calcium 9.3  8.4 - 13.0 (mg/dL)   Total Protein 8.0  6.0 - 8.3 (g/dL)   Albumin 4.1  3.5 - 5.2 (g/dL)   AST 865 (*) 0 - 37 (U/L)   ALT 152 (*) 0 - 35 (U/L)   Alkaline Phosphatase 81  39 - 117 (U/L)   Total Bilirubin 0.3  0.3 - 1.2 (mg/dL)   GFR calc non Af Amer 68 (*) >90 (mL/min)   GFR calc Af Amer 79 (*) >90 (mL/min)      Component Value Range   D-Dimer, Quant 0.80 (*) 0.00 - 0.48      Component Value Range   Color, Urine YELLOW  YELLOW    APPearance CLOUDY (*) CLEAR    Specific Gravity, Urine 1.015  1.005 - 1.030    pH 6.5  5.0 - 8.0    Glucose, UA NEGATIVE  NEGATIVE (mg/dL)   Hgb urine dipstick NEGATIVE  NEGATIVE    Bilirubin Urine NEGATIVE  NEGATIVE    Ketones, ur NEGATIVE  NEGATIVE (mg/dL)   Protein, ur NEGATIVE  NEGATIVE (mg/dL)   Urobilinogen, UA 1.0  0.0 - 1.0 (mg/dL)   Nitrite NEGATIVE  NEGATIVE    Leukocytes, UA SMALL (*) NEGATIVE       Component Value Range   Squamous Epithelial / LPF RARE  RARE    WBC, UA 7-10  <3 (WBC/hpf)   RBC / HPF 3-6  <3 (RBC/hpf)   Bacteria, UA FEW (*) RARE    Urine-Other MUCOUS     Radiological Exams on Admission:  CXR: 05/22/2011 No acute cardiopulmonary process.  CT ANGIO CHEST: 05/22/2011 IMPRESSION:   No evidence of significant pulmonary embolus.   Otherwise stable appearance of the chest with calcified pleural plaques and calcified granulomas bilaterally.   Stable appearance of hepatic cyst and right thyroid nodules.   Atelectasis in the left lung base.  Assessment/Plan  Shortness of breath with pressure around the throat area - unclear etiology at this time but with  ST segment changes noted on 12 lead EKG and given history of HTN, HLD will admit to telemetry floor for observation - will continue to monitor CE's every 8 hours, continue telemetry monitoring - check TSH, lipid panel, A1C - start supportive care with Aspirin, nitroglycerin as needed, morphine as needed, oxygen as needed - check USD - please note that CT angio is negative for PE  Hypokalemia -  check magnesium level - supplement  Transaminitis - unclear etiology at this time  - will check RUQ [place] as pt has some tenderness in that area - CMET in AM - check Alcohol level and UDS, hepatitis panel  Hyperglycemia - check A1C - initiate regimen as indicated for proper sugar control  DVT Prophylaxis - Lovenox  Code Status - Full  Education  - test results and diagnostic studies were discussed with patient  - patient verbalized the understanding - questions were answered at the bedside and contact information was provided for additional questions or concerns  Time Spent on Admission: Over 30 minutes  MAGICK-Stacy Schultz 05/23/2011, 12:57 AM  [facility] Pager # [phone] Main Office # [phone]

## 2011-05-23 NOTE — Progress Notes (Signed)
Pt with temp 101, Remains with congested cough and nasal congestion. Elray Mcgregor, NP notified and new orders received. Will monitor.

## 2011-05-23 NOTE — Progress Notes (Signed)
CARDIOLOGY CONSULT NOTE  Patient ID: Stacy Schultz MRN: [id number] DOB/AGE: November 24, 1950 61 y.o.  Admit date: 05/22/2011 Primary Physician  Marga Melnick, MD Primary Cardiologist Dr. Shirlee Latch Chief Complaint  Abnormal EKG  HPI: The patient is admitted with SOB and throat pain.  She reports a cough for 5 days.  Non productive.  Low grade temp.  She came to the ER with SOB when she couldn't get her heart rate down.   Cardiac enzymes x 2 have been negative.  AST and ALT are elevated.  She has had an abdominal ultrasound with gallstones but without cholecystitis.  She was found to have hydronephrosis.   D dimer was elevated but CT of the chest demonstrated no acute pulmonary emboli.  The patient was seen previously for evaluation of chest pain.  She had a negative stress perfusions study in 2009 without evidence for ischemia.  Echo did not demonstrate MVP.  We are called because of an abnormal EKG described below.  However, this was compared to a previous EKG from 2008 and there was no change.  The patient does some walking and is active.  The patient denies any new symptoms such as chest discomfort, neck or arm discomfort. There has been no new shortness of breath, PND or orthopnea. There have been no reported palpitations, presyncope or syncope.   Past Medical History  Diagnosis Date  . Hamartoma     [place]    Past Surgical History  Procedure Date  . Salpingectomy     RIGHT/FOR ECTOPIC PREGNANCY  . Excision of lipoma   . Tonsillectomy   . Oophorectomy   . Vaginal hysterectomy 03/2003    LAVH, BSO    No Known Allergies Prescriptions prior to admission  Medication Sig Dispense Refill  . guaiFENesin (MUCINEX) 600 MG 12 hr tablet Take 600 mg by mouth.      . Ibuprofen (ADVIL) 200 MG CAPS Take 400 mg by mouth every 6 (six) hours as needed. Pain.       Family History  Problem Relation Age of Onset  . Heart disease Daughter     DIED AT 30Y0 WITH ENDOCARDITIS (The patient actually describes  an illness that sounds like acute myocarditis with vfib arrest.)  . Diabetes Mother   . Cancer Father     Mesothelioma  . Diabetes Maternal Aunt   . Ovarian cancer Maternal Aunt   . Heart failure Maternal Grandmother   . Hypertension Maternal Grandmother   . Thyroid disease Maternal Grandmother     History   Social History  . Marital Status: Divorced    Spouse Name: N/A    Number of Children: 5  . Years of Education: N/A   Occupational History  .     Social History Main Topics  . Smoking status: Never Smoker   . Smokeless tobacco: Not on file  . Alcohol Use: No  . Drug Use: No  . Sexually Active: No   Other Topics Concern  . Not on file   Social History Narrative   Lives at home with mother.       ROS:  As stated in the HPI and negative for all other systems.  Physical Exam: Blood pressure 100/62, pulse 71, temperature 99.1 F (37.3 C), temperature source Oral, resp. rate 16, height 5\' 4"  (1.626 m), weight 62.914 kg (138 lb 11.2 oz), SpO2 94.00%.  GENERAL:  Well appearing HEENT:  Pupils equal round and reactive, fundi not visualized, oral mucosa unremarkable NECK:  No jugular  venous distention, waveform within normal limits, carotid upstroke brisk and symmetric, no bruits, no thyromegaly LYMPHATICS:  No cervical, inguinal adenopathy LUNGS:  Clear to auscultation bilaterally BACK:  No CVA tenderness CHEST:  Unremarkable HEART:  PMI not displaced or sustained,S1 and S2 within normal limits, no S3, no S4, no clicks, no rubs, no murmurs ABD:  Flat, positive bowel sounds normal in frequency in pitch, no bruits, no rebound, no guarding, no midline pulsatile mass, no hepatomegaly, no splenomegaly EXT:  2 plus pulses throughout, no edema, no cyanosis no clubbing SKIN:  No rashes no nodules NEURO:  Cranial nerves II through XII grossly intact, motor grossly intact throughout PSYCH:  Cognitively intact, oriented to person place and time  Labs: Lab Results  Component  Value Date   BUN 9 05/23/2011   Lab Results  Component Value Date   CREATININE 0.80 05/23/2011   Lab Results  Component Value Date   NA 134* 05/23/2011   K 3.0* 05/23/2011   CL 96 05/23/2011   CO2 23 05/23/2011   Lab Results  Component Value Date   CKTOTAL 91 05/23/2011   CKMB 1.3 05/23/2011   TROPONINI <0.30 05/23/2011   Lab Results  Component Value Date   WBC 3.8* 05/23/2011   HGB 14.7 05/23/2011   HCT 43.2 05/23/2011   MCV 85.4 05/23/2011   PLT 148* 05/23/2011   Lab Results  Component Value Date   CHOL 111 05/23/2011   HDL 30* 05/23/2011   LDLCALC 64 05/23/2011   TRIG 83 05/23/2011   CHOLHDL 3.7 05/23/2011   Lab Results  Component Value Date   ALT 130* 05/23/2011   AST 125* 05/23/2011   ALKPHOS 71 05/23/2011   BILITOT 0.2* 05/23/2011     Radiology:    CXR:  No acute finding. Stable compared to prior exam.  EKG:  NSR rate 71 axis WNL, T wave inversion anterior leads with 1/2 mm ST depression in the inferior and lateral leads without old EKG for comparison.  05/23/2011   ASSESSMENT AND PLAN:   1)  Chest pain/abnormal EKG:  Non anginal complaints.  EKG is not changed from 2008.  No further work up is indicated.  Treatment of cough per primary team.    2)  MVP:  No MVP on last echo.  No further evaluation.  I have removed this from her history and problem list.  She does not need SBE prophylaxis in the future.     SignedRollene Rotunda 05/23/2011, 3:33 PM

## 2011-05-23 NOTE — Progress Notes (Signed)
Subjective: Main complaint today is cough and chest wall tenderness associated with coughing.  Objective: Vital signs in last 24 hours: Temp:  [98.2 F (36.8 C)-99 F (37.2 C)] 99 F (37.2 C) (05/12 0500) Pulse Rate:  [69-98] 69  (05/12 0500) Resp:  [15-20] 15  (05/12 0500) BP: (91-128)/(58-77) 91/58 mmHg (05/12 0500) SpO2:  [94 %-99 %] 94 % (05/12 0500) Weight:  [62.914 kg (138 lb 11.2 oz)-63.504 kg (140 lb)] 62.914 kg (138 lb 11.2 oz) (05/12 0212) Weight change:  Last BM Date: 05/22/11  Intake/Output from previous day: 05/11 0701 - 05/12 0700 In: 200 [I.V.:200] Out: 20 [Urine:20]     Physical Exam: General: Alert, awake, oriented x3, in no acute distress. HEENT: No bruits, no goiter. Heart: Regular rate and rhythm, without murmurs, rubs, gallops. Lungs: Clear to auscultation bilaterally. Abdomen: Soft, nontender, nondistended, positive bowel sounds. Extremities: No clubbing cyanosis or edema with positive pedal pulses. Neuro: Grossly intact, nonfocal.    Lab Results: Basic Metabolic Panel:  Basename 05/23/11 0255 05/23/11 0254 05/22/11 2124  NA 134* -- 135  K 3.0* -- 3.3*  CL 96 -- 98  CO2 23 -- 23  GLUCOSE 123* -- 134*  BUN 9 -- 9  CREATININE 0.80 -- 0.89  CALCIUM 9.1 -- 9.3  MG -- 2.0 --  PHOS -- 3.4 --   Liver Function Tests:  [facility] 05/23/11 0255 05/22/11 2124  AST 125* 146*  ALT 130* 152*  ALKPHOS 71 81  BILITOT 0.2* 0.3  PROT 7.0 8.0  ALBUMIN 3.4* 4.1   CBC:  Basename 05/23/11 0255 05/22/11 2124  WBC 3.8* 5.2  NEUTROABS -- 3.8  HGB 14.7 16.1*  HCT 43.2 46.5*  MCV 85.4 84.7  PLT 148* 177   Cardiac Enzymes:  Basename 05/23/11 1000 05/23/11 0254 05/22/11 2124  CKTOTAL 91 98 --  CKMB 1.3 1.3 --  CKMBINDEX -- -- --  TROPONINI <0.30 <0.30 <0.30   D-Dimer:  Basename 05/22/11 2124  DDIMER 0.80*   CBG:  Basename 05/23/11 0754  GLUCAP 103*   Hemoglobin A1C:  Basename 05/23/11 0254  HGBA1C 6.2*   Fasting Lipid  Panel:  Basename 05/23/11 0254  CHOL 111  HDL 30*  LDLCALC 64  TRIG 83  CHOLHDL 3.7  LDLDIRECT --   Thyroid Function Tests:  Basename 05/23/11 0254  TSH 1.982  T4TOTAL --  FREET4 --  T3FREE --  THYROIDAB --   Urine Drug Screen: Drugs of Abuse     Component Value Date/Time   LABOPIA NONE DETECTED 05/23/2011 0153   COCAINSCRNUR NONE DETECTED 05/23/2011 0153   LABBENZ NONE DETECTED 05/23/2011 0153   AMPHETMU NONE DETECTED 05/23/2011 0153   THCU NONE DETECTED 05/23/2011 0153   LABBARB NONE DETECTED 05/23/2011 0153    Alcohol Level:  Basename 05/23/11 0254  ETH <11   Urinalysis:  Basename 05/22/11 2148  COLORURINE YELLOW  LABSPEC 1.015  PHURINE 6.5  GLUCOSEU NEGATIVE  HGBUR NEGATIVE  BILIRUBINUR NEGATIVE  KETONESUR NEGATIVE  PROTEINUR NEGATIVE  UROBILINOGEN 1.0  NITRITE NEGATIVE  LEUKOCYTESUR SMALL*    Studies/Results: Dg Chest 2 View  05/22/2011  *RADIOLOGY REPORT*  Clinical Data: Shortness of breath, cough and congestion.  CHEST - 2 VIEW  Comparison: CT chest 10/21/2008 and plain film chest 11/07/2007.  Findings: Calcified pleural plaques are again seen.  Lungs are clear.  Heart size is normal.  No pneumothorax or pleural effusion. Heart size is normal.  IMPRESSION: No acute finding.  Stable compared to prior exam.  Original Report Authenticated  By: Bernadene Bell Maricela Curet, M.D.   Ct Angio Chest W/cm &/or Wo Cm  05/23/2011  *RADIOLOGY REPORT*  Clinical Data: Cough, increased heart rate, and difficulty breathing for 3 days.  D-dimer 0.8.  CT ANGIOGRAPHY CHEST  Technique:  Multidetector CT imaging of the chest using the standard protocol during bolus administration of intravenous contrast. Multiplanar reconstructed images including MIPs were obtained and reviewed to evaluate the vascular anatomy.  Contrast: OMNIPAQUE IOHEXOL 300 MG/ML  SOLN  Comparison: 10/21/2008  Findings: Technically adequate study with good opacification of the central and segmental pulmonary  arteries.  No focal filling defect. No evidence of significant pulmonary embolus.  Normal caliber thoracic aorta with mild calcification.  Normal heart size.  Esophagus is decompressed.  No significant lymphadenopathy in the chest.  Nodular enlargement of the right thyroid gland appears stable.  No pleural effusion.  Cyst in the medial segment left lobe of the liver stable since previous study.  Bilateral calcified pleural plaques.  This may be correlated with previous asbestos exposure.  Mild dependent changes in the lung bases.  Atelectasis in the left lung base.  No focal consolidation or interstitial disease.  Airways appear patent.  No pneumothorax. Degenerative changes in the thoracic spine.  Calcified granulomas in the lungs.  IMPRESSION: No evidence of significant pulmonary embolus.  Otherwise stable appearance of the chest with calcified pleural plaques and calcified granulomas bilaterally.  Stable appearance of hepatic cyst and right thyroid nodules.  Atelectasis in the left lung base.  Original Report Authenticated By: Marlon Pel, M.D.   US Abdomen Complete  05/23/2011  *RADIOLOGY REPORT*  Clinical Data:  Elevated liver transaminases and right upper quadrant abdominal pain.  ABDOMEN ULTRASOUND  Technique:  Complete abdominal ultrasound examination was performed including evaluation of the liver, gallbladder, bile ducts, pancreas, kidneys, spleen, IVC, and abdominal aorta.  Comparison:  CT of chest dated 05/22/2011  Findings:  Gallbladder:  Numerous shadowing gallstones are present in the gallbladder.  There is no evidence of gallbladder wall dilatation, gallbladder wall thickening, pericholecystic fluid or sonographic Murphy's sign.  Common Bile Duct:  Normal caliber of 3 mm.  Liver:  Cystic structure in the medial segment of the left lobe of the liver measures 2.4 x 1.9 x 2.2 cm.  This has the appearance of a cyst of the liver.  This was also previously seen by CT.  The liver itself shows mild  and diffuse increased echogenicity, likely reflecting steatosis.  No solid mass or intrahepatic biliary dilatation identified by ultrasound.  The portal vein is patent.  IVC:  Patent throughout its visualized course in the abdomen.  Pancreas:  Although the pancreas is difficult to visualize in its entirety, no focal pancreatic abnormality is identified.  Spleen:  The spleen is of normal echotexture and size.  Kidneys:  The left kidney measures 11.1 cm and demonstrates evidence of moderate hydronephrosis.  Etiology is not determined by ultrasound.  No evidence of focal mass of the left kidney.  The right kidney measures 9.9 cm and has a normal sonographic appearance.  Abdominal Aorta:  Normal caliber abdominal aorta.  IMPRESSION:  1.  Cholelithiasis with numerous gallstones present.  No evidence of biliary obstruction or signs of cholecystitis by ultrasound. 2.  Left hydronephrosis of unclear etiology.  Recommend further abdominal and pelvic CT evaluation with contrast including delayed renal and pelvic imaging to evaluate for renal excretion and potential ureteral or bladder pathology. 3.  Echogenic liver likely reflecting steatosis.  Left lobe hepatic  cyst.  Original Report Authenticated By: Reola Calkins, M.D.    Medications: Scheduled Meds:   . aspirin  324 mg Oral Once  . aspirin EC  81 mg Oral Daily  . enoxaparin  40 mg Subcutaneous Q24H  . guaiFENesin  1,200 mg Oral BID  . potassium chloride  40 mEq Oral Once  . sodium chloride  3 mL Intravenous Q12H   Continuous Infusions:   . sodium chloride 50 mL/hr at 05/23/11 0200   PRN Meds:.chlorpheniramine-HYDROcodone, HYDROcodone-acetaminophen, iohexol, morphine injection, ondansetron (ZOFRAN) IV, ondansetron  Assessment/Plan:  Principal Problem:  *SOB (shortness of breath) Active Problems:  MITRAL VALVE PROLAPSE  URTI (acute upper respiratory infection)  Abnormal EKG  Transaminitis   #1 shortness of breath/cough: This has been  present for 3 days. I think most likely this is related to an upper respiratory tract infection, since likely viral in origin do not believe antibiotics are required at this point. Will treat her cough symptomatically with Mucinex and Tussionex when necessary.  #2 anterolateral ST depressions with T-wave inversions. Review of chart demonstrates that in 2009 she was admitted to [facility] [place] with chest pain. Was subsequently seen by Dr. Shirlee Latch with [facility] cardiology in the office. There is a report of a Myoview exercise stress test that showed an ejection fraction of 74% and no perfusion abnormalities. I am unable to find any prior EKGs to see if these changes are new or old. She is not complaining of any chest pain although she has significant shortness of breath and I wonder if this could be an anginal equivalent. She does have coronary artery disease risk factors including hypertension and hyperlipidemia. I have asked Dr. Antoine Poche with [facility] cardiology to please evaluate patient.  #3 transaminitis: Alkaline phosphatase and bili are normal. Her AST and ALT are in the mid 100 range. Right upper quadrant ultrasound shows evidence for cholelithiasis however no evidence for biliary obstruction or acute cholecystitis. Will ask for an acute hepatitis panel. She does not have any biliary colic so do not believe that her cholelithiasis needs to be further investigated at this point. She does not take any statins at home or any medications that would explain her transaminitis.  #4 left hydronephrosis: This was observed on her ultrasound. She does not have acute renal failure. This will not be further investigated at this time.  #5 disposition: Await cardiology evaluation.   LOS: 1 day   [facility] Pager: [phone] 05/23/2011, 12:48 PM

## 2011-05-23 NOTE — Progress Notes (Signed)
ANTIBIOTIC CONSULT NOTE - INITIAL  Pharmacy Consult for Rocephin Indication: URTI  No Known Allergies  Patient Measurements: Height: 5\' 4"  (162.6 cm) Weight: 138 lb 11.2 oz (62.914 kg) IBW/kg (Calculated) : 54.7    Vital Signs: Temp: 101 F (38.3 C) (05/12 2156) Temp src: Oral (05/12 2156) BP: 109/72 mmHg (05/12 2156) Pulse Rate: 79  (05/12 2156) Intake/Output from previous day: 05/11 0701 - 05/12 0700 In: 200 [I.V.:200] Out: 20 [Urine:20] Intake/Output from this shift:    Labs:  Basename 05/23/11 0255 05/22/11 2124  WBC 3.8* 5.2  HGB 14.7 16.1*  PLT 148* 177  LABCREA -- --  CREATININE 0.80 0.89   Estimated Creatinine Clearance: 63 ml/min (by C-G formula based on Cr of 0.8). No results found for this basename: VANCOTROUGH:2,VANCOPEAK:2,VANCORANDOM:2,GENTTROUGH:2,GENTPEAK:2,GENTRANDOM:2,TOBRATROUGH:2,TOBRAPEAK:2,TOBRARND:2,AMIKACINPEAK:2,AMIKACINTROU:2,AMIKACIN:2, in the last 72 hours   Microbiology: No results found for this or any previous visit (from the past 720 hour(s)).  Medical History: Past Medical History  Diagnosis Date  . Hamartoma     [place]    Medications:  Scheduled:    . acetaminophen  650 mg Oral Once  . aspirin EC  81 mg Oral Daily  . cefTRIAXone (ROCEPHIN) IVPB 1 gram/50 mL D5W  1 g Intravenous Q24H  . enoxaparin  40 mg Subcutaneous Q24H  . guaiFENesin  1,200 mg Oral BID  . potassium chloride  40 mEq Oral Once  . sodium chloride  3 mL Intravenous Q12H   Infusions:    . sodium chloride 50 mL/hr at 05/23/11 0200   Assessment: 61 yo female admitted with cough and chest wall tenderness with probable viral illness, now with elevated temp. (101)   Goal of Therapy:  Treat infection  Plan:   Rocephin 1gm IV q24h.  Lorenza Evangelist 05/23/2011,11:44 PM

## 2011-05-23 NOTE — ED Notes (Signed)
RUE:AVWU<JW> Expected date:<BR> Expected time:<BR> Means of arrival:<BR> Comments:<BR> Fall

## 2011-05-24 DIAGNOSIS — I10 Essential (primary) hypertension: Secondary | ICD-10-CM

## 2011-05-24 DIAGNOSIS — R079 Chest pain, unspecified: Secondary | ICD-10-CM

## 2011-05-24 DIAGNOSIS — E782 Mixed hyperlipidemia: Secondary | ICD-10-CM

## 2011-05-24 LAB — GLUCOSE, CAPILLARY: Glucose-Capillary: 110 mg/dL — ABNORMAL HIGH (ref 70–99)

## 2011-05-24 LAB — URINE CULTURE

## 2011-05-24 LAB — COMPREHENSIVE METABOLIC PANEL
Alkaline Phosphatase: 68 U/L (ref 39–117)
BUN: 7 mg/dL (ref 6–23)
CO2: 29 mEq/L (ref 19–32)
GFR calc Af Amer: 65 mL/min — ABNORMAL LOW (ref >90)
GFR calc non Af Amer: 56 mL/min — ABNORMAL LOW (ref >90)
Glucose, Bld: 100 mg/dL — ABNORMAL HIGH (ref 70–99)
Potassium: 4 mEq/L (ref 3.5–5.1)
Total Bilirubin: 0.2 mg/dL — ABNORMAL LOW (ref 0.3–1.2)
Total Protein: 6.6 g/dL (ref 6.0–8.3)

## 2011-05-24 LAB — CBC
HCT: 41.4 % (ref 36.0–46.0)
Hemoglobin: 13.7 g/dL (ref 12.0–15.0)
MCHC: 33.1 g/dL (ref 30.0–36.0)
RBC: 4.8 MIL/uL (ref 3.87–5.11)

## 2011-05-24 MED ORDER — GUAIFENESIN ER 600 MG PO TB12: 1200.0000 mg | ORAL_TABLET | Freq: Two times a day (BID) | ORAL | Status: AC

## 2011-05-24 MED ORDER — HYDROCOD POLST-CHLORPHEN POLST 10-8 MG/5ML PO LQCR: 5.0000 mL | Freq: Two times a day (BID) | ORAL | Status: DC | PRN

## 2011-05-24 NOTE — Discharge Summary (Signed)
Physician Discharge Summary  Patient ID: LYNN RECENDIZ MRN: [id number] DOB/AGE: [birth year] 61 y.o.  Admit date: 05/22/2011 Discharge date: 05/24/2011  Primary Care Physician:  Marga Melnick, MD, MD   Discharge Diagnoses:    Principal Problem:  *SOB (shortness of breath) Active Problems:  URTI (acute upper respiratory infection)  Abnormal EKG  Transaminitis  Hydronephrosis of left kidney    Medication List  As of 05/24/2011 10:37 AM   TAKE these medications         ADVIL 200 MG Caps   Generic drug: Ibuprofen   Take 400 mg by mouth every 6 (six) hours as needed. Pain.      chlorpheniramine-HYDROcodone 10-8 MG/5ML Lqcr   Commonly known as: TUSSIONEX   Take 5 mLs by mouth every 12 (twelve) hours as needed.      guaiFENesin 600 MG 12 hr tablet   Commonly known as: MUCINEX   Take 2 tablets (1,200 mg total) by mouth 2 (two) times daily.             Disposition and Follow-up:  Will be discharged home today in stable and improved condition. Will need followup with PCP in 6 weeks for recheck of her LFTs.  Consults:  cardiology Dr.Hochrein   Significant Diagnostic Studies:  Dg Chest 2 View  05/22/2011  *RADIOLOGY REPORT*  Clinical Data: Shortness of breath, cough and congestion.  CHEST - 2 VIEW  Comparison: CT chest 10/21/2008 and plain film chest 11/07/2007.  Findings: Calcified pleural plaques are again seen.  Lungs are clear.  Heart size is normal.  No pneumothorax or pleural effusion. Heart size is normal.  IMPRESSION: No acute finding.  Stable compared to prior exam.  Original Report Authenticated By: Bernadene Bell. Maricela Curet, M.D.   Ct Angio Chest W/cm &/or Wo Cm  05/23/2011  *RADIOLOGY REPORT*  Clinical Data: Cough, increased heart rate, and difficulty breathing for 3 days.  D-dimer 0.8.  CT ANGIOGRAPHY CHEST  Technique:  Multidetector CT imaging of the chest using the standard protocol during bolus administration of intravenous contrast. Multiplanar reconstructed images  including MIPs were obtained and reviewed to evaluate the vascular anatomy.  Contrast: OMNIPAQUE IOHEXOL 300 MG/ML  SOLN  Comparison: 10/21/2008  Findings: Technically adequate study with good opacification of the central and segmental pulmonary arteries.  No focal filling defect. No evidence of significant pulmonary embolus.  Normal caliber thoracic aorta with mild calcification.  Normal heart size.  Esophagus is decompressed.  No significant lymphadenopathy in the chest.  Nodular enlargement of the right thyroid gland appears stable.  No pleural effusion.  Cyst in the medial segment left lobe of the liver stable since previous study.  Bilateral calcified pleural plaques.  This may be correlated with previous asbestos exposure.  Mild dependent changes in the lung bases.  Atelectasis in the left lung base.  No focal consolidation or interstitial disease.  Airways appear patent.  No pneumothorax. Degenerative changes in the thoracic spine.  Calcified granulomas in the lungs.  IMPRESSION: No evidence of significant pulmonary embolus.  Otherwise stable appearance of the chest with calcified pleural plaques and calcified granulomas bilaterally.  Stable appearance of hepatic cyst and right thyroid nodules.  Atelectasis in the left lung base.  Original Report Authenticated By: Marlon Pel, M.D.   US Abdomen Complete  05/23/2011  *RADIOLOGY REPORT*  Clinical Data:  Elevated liver transaminases and right upper quadrant abdominal pain.  ABDOMEN ULTRASOUND  Technique:  Complete abdominal ultrasound examination was performed including evaluation of the  liver, gallbladder, bile ducts, pancreas, kidneys, spleen, IVC, and abdominal aorta.  Comparison:  CT of chest dated 05/22/2011  Findings:  Gallbladder:  Numerous shadowing gallstones are present in the gallbladder.  There is no evidence of gallbladder wall dilatation, gallbladder wall thickening, pericholecystic fluid or sonographic Murphy's sign.  Common Bile  Duct:  Normal caliber of 3 mm.  Liver:  Cystic structure in the medial segment of the left lobe of the liver measures 2.4 x 1.9 x 2.2 cm.  This has the appearance of a cyst of the liver.  This was also previously seen by CT.  The liver itself shows mild and diffuse increased echogenicity, likely reflecting steatosis.  No solid mass or intrahepatic biliary dilatation identified by ultrasound.  The portal vein is patent.  IVC:  Patent throughout its visualized course in the abdomen.  Pancreas:  Although the pancreas is difficult to visualize in its entirety, no focal pancreatic abnormality is identified.  Spleen:  The spleen is of normal echotexture and size.  Kidneys:  The left kidney measures 11.1 cm and demonstrates evidence of moderate hydronephrosis.  Etiology is not determined by ultrasound.  No evidence of focal mass of the left kidney.  The right kidney measures 9.9 cm and has a normal sonographic appearance.  Abdominal Aorta:  Normal caliber abdominal aorta.  IMPRESSION:  1.  Cholelithiasis with numerous gallstones present.  No evidence of biliary obstruction or signs of cholecystitis by ultrasound. 2.  Left hydronephrosis of unclear etiology.  Recommend further abdominal and pelvic CT evaluation with contrast including delayed renal and pelvic imaging to evaluate for renal excretion and potential ureteral or bladder pathology. 3.  Echogenic liver likely reflecting steatosis.  Left lobe hepatic cyst.  Original Report Authenticated By: Reola Calkins, M.D.    Brief H and P: For complete details please refer to admission H and P, but in brief patient is a 61 year old female with history of MVP who presents with main concern of progressively worsening pressure around the throat area that initially started 2-3 days prior to admission and is associated with nonproductive cough (occasionally slightly productive but mostly dry) and shortness of breath. She describes occasional heaviness in her chest, non  radiating, but not "real chest pain". She denies any specific aggravating or alleviating factors and denies similar episodes in the past. She denies any nasal congestion, rhinorrhea sore throat. She also denies any abdominal pain, nausea or vomiting, diaphoresis, recent long travel, history of cancer, history of recent surgery, history of DVT or PE, history estrogen use. We were asked to admit her for further evaluation and management.     Hospital Course:  Principal Problem:  *SOB (shortness of breath) Active Problems:  URTI (acute upper respiratory infection)  Abnormal EKG  Transaminitis  Hydronephrosis of left kidney   #1 URTI: Her main complaint is cough and runny nose. Has had improvement with mucinex and tussionex.  #2 Abnormal EKG: with ant-lat ST depressions and T wave inversions. Cards was consulted. They found and EKG from 2008 with the same changes. She had a normal exercise myoview in 2009. No further cards workup is planned at this time.  #3 Transaminitis: Non-obstructive pattern. [place] with cholelithiasis, but without biliary ductal dilatation. Has never had biliary colic. Not on any meds. Followup LFTs in 6 weeks. Hepatitis Panel ordered but still pending at time of discharge.  #4 Left hydronephrosis: noted incidentally on [place]. No ARF. Since asymptomatic, no workup at this time.  Ready for DC home today.  Time spent on Discharge: Greater than 30 minutes.  SignedChaya Jan [facility] Pager: [phone] 05/24/2011, 10:37 AM

## 2011-05-24 NOTE — Telephone Encounter (Signed)
Error

## 2011-05-24 NOTE — Progress Notes (Signed)
Patient ID: Stacy Schultz, female   DOB: [birth year], 61 y.o.   MRN: [id number]  CARDIOLOGY CONSULT NOTE  Patient ID: Stacy Schultz MRN: [id number] DOB/AGE: May 16, 1950 61 y.o.  Admit date: 05/22/2011 Primary Physician  Marga Melnick, MD Primary Cardiologist Dr. Shirlee Latch Chief Complaint  Abnormal EKG  HPI: The patient is admitted with SOB and throat pain.  She reports a cough for 5 days.  Non productive.  Low grade temp.  She came to the ER with SOB when she couldn't get her heart rate down.   Cardiac enzymes x 2 have been negative.  AST and ALT are elevated.  She has had an abdominal ultrasound with gallstones but without cholecystitis.  She was found to have hydronephrosis.   D dimer was elevated but CT of the chest demonstrated no acute pulmonary emboli.  The patient was seen previously for evaluation of chest pain.  She had a negative stress perfusions study in 2009 without evidence for ischemia.  Echo did not demonstrate MVP.    The patient does some walking and is active.  The patient denies any new symptoms such as chest discomfort, neck or arm discomfort. There has been no new shortness of breath, PND or orthopnea. There have been no reported palpitations, presyncope or syncope.   Past Medical History  Diagnosis Date  . Hamartoma     [place]    Past Surgical History  Procedure Date  . Salpingectomy     RIGHT/FOR ECTOPIC PREGNANCY  . Excision of lipoma   . Tonsillectomy   . Oophorectomy   . Vaginal hysterectomy 03/2003    LAVH, BSO    No Known Allergies Prescriptions prior to admission  Medication Sig Dispense Refill  . guaiFENesin (MUCINEX) 600 MG 12 hr tablet Take 600 mg by mouth.      . Ibuprofen (ADVIL) 200 MG CAPS Take 400 mg by mouth every 6 (six) hours as needed. Pain.       Family History  Problem Relation Age of Onset  . Heart disease Daughter     DIED AT 63Y0 WITH ENDOCARDITIS (The patient actually describes an illness that sounds like acute myocarditis with vfib  arrest.)  . Diabetes Mother   . Cancer Father     Mesothelioma  . Diabetes Maternal Aunt   . Ovarian cancer Maternal Aunt   . Heart failure Maternal Grandmother   . Hypertension Maternal Grandmother   . Thyroid disease Maternal Grandmother     History   Social History  . Marital Status: Divorced    Spouse Name: N/A    Number of Children: 5  . Years of Education: N/A   Occupational History  .     Social History Main Topics  . Smoking status: Never Smoker   . Smokeless tobacco: Not on file  . Alcohol Use: No  . Drug Use: No  . Sexually Active: No   Other Topics Concern  . Not on file   Social History Narrative   Lives at home with mother.       ROS:  As stated in the HPI and negative for all other systems.  Physical Exam: Blood pressure 101/67, pulse 62, temperature 98.9 F (37.2 C), temperature source Oral, resp. rate 16, height 5\' 4"  (1.626 m), weight 62.914 kg (138 lb 11.2 oz), SpO2 94.00%.  GENERAL:  Well appearing HEENT:  Pupils equal round and reactive, fundi not visualized, oral mucosa unremarkable NECK:  No jugular venous distention, waveform within normal limits, carotid upstroke  brisk and symmetric, no bruits, no thyromegaly LYMPHATICS:  No cervical, inguinal adenopathy LUNGS:  Clear to auscultation bilaterally BACK:  No CVA tenderness CHEST:  Unremarkable HEART:  PMI not displaced or sustained,S1 and S2 within normal limits, no S3, no S4, no clicks, no rubs, no murmurs ABD:  Flat, positive bowel sounds normal in frequency in pitch, no bruits, no rebound, no guarding, no midline pulsatile mass, no hepatomegaly, no splenomegaly EXT:  2 plus pulses throughout, no edema, no cyanosis no clubbing SKIN:  No rashes no nodules NEURO:  Cranial nerves II through XII grossly intact, motor grossly intact throughout PSYCH:  Cognitively intact, oriented to person place and time  Labs: Lab Results  Component Value Date   BUN 7 05/24/2011   Lab Results  Component  Value Date   CREATININE 1.04 05/24/2011   Lab Results  Component Value Date   NA 136 05/24/2011   K 4.0 05/24/2011   CL 100 05/24/2011   CO2 29 05/24/2011   Lab Results  Component Value Date   CKTOTAL 93 05/23/2011   CKMB 1.2 05/23/2011   TROPONINI <0.30 05/23/2011   Lab Results  Component Value Date   WBC 4.1 05/24/2011   HGB 13.7 05/24/2011   HCT 41.4 05/24/2011   MCV 86.3 05/24/2011   PLT 135* 05/24/2011   Lab Results  Component Value Date   CHOL 111 05/23/2011   HDL 30* 05/23/2011   LDLCALC 64 05/23/2011   TRIG 83 05/23/2011   CHOLHDL 3.7 05/23/2011   Lab Results  Component Value Date   ALT 112* 05/24/2011   AST 107* 05/24/2011   ALKPHOS 68 05/24/2011   BILITOT 0.2* 05/24/2011     Radiology:    CXR:  No acute finding. Stable compared to prior exam.  EKG:  NSR rate 71 axis WNL, T wave inversion anterior leads with 1/2 mm ST depression in the inferior and lateral leads without old EKG for comparison.  05/24/2011   ASSESSMENT AND PLAN:   1)  Chest pain/abnormal EKG:  Non anginal complaints.  EKG is not changed from 2008.  No further work up is indicated.  Treatment of cough per primary team.   Patient with no pain this am  2)  MVP:  No MVP on last echo.  No further evaluation.  I have removed this from her history and problem list.  She does not need SBE prophylaxis in the future.    3) Elevated LFT;s Plan per primary team nonobsturctive pattern with normal Tbili  4) Hydronephrosis:  No signs of infection plan per primary team  Will sign off   Signed: Charlton Haws 05/24/2011, 8:22 AM

## 2011-05-24 NOTE — ED Provider Notes (Signed)
Medical screening examination/treatment/procedure(s) were conducted as a shared visit with non-physician practitioner(s) and myself.  I personally evaluated the patient during the encounter  Shortness of breath. Abnl EKG. RRR, CTAB. CTA negative for pneumonia, PE. First trop negative. [facility] for further w/u and evaluation.  Forbes Cellar, MD 05/24/11 [phone]

## 2011-05-30 LAB — CULTURE, BLOOD (ROUTINE X 2)
Culture  Setup Time: 201305131128
Culture: NO GROWTH

## 2011-11-16 ENCOUNTER — Telehealth: Payer: Self-pay | Admitting: Cardiovascular Disease

## 2011-11-16 NOTE — Telephone Encounter (Signed)
Pt called this am asking For letter/LOV to be faxed over to her Dentist  [facility]/Bell stating she does Not have Mitro-Valve Pro-Lapse, Dentist Will  Not do procedure on Pt Without this Being faxed over, I called Dentist @  [phone], and their VoiceMail states their Office Is Closed and they Will  Not Re-open Until Thursday 11/18/11, pt is aware I cannot fax anything over  Until I speak with Someone In the Dentist Office. Will call Thursday @ 8:30 Once Office Opens.

## 2011-11-25 NOTE — Telephone Encounter (Signed)
Spoke With Joyce/Dr.Bell's office Faxed over Cardiology Consult Note From 05/24/11 W/ Dr.Nishan Stating MVP has been removed from Pt's History, this was to be faxed over to Dr.Bell's Office @ [phone]  for her to proceed with Dental procedure .

## 2011-12-30 ENCOUNTER — Encounter: Payer: Self-pay | Admitting: Obstetrics and Gynecology

## 2012-10-06 ENCOUNTER — Encounter (HOSPITAL_COMMUNITY): Payer: Self-pay | Admitting: Emergency Medicine

## 2012-10-06 ENCOUNTER — Emergency Department (HOSPITAL_COMMUNITY)
Admission: EM | Admit: 2012-10-06 | Discharge: 2012-10-06 | Disposition: A | Discharge status: Home / Self Care | Payer: BC Managed Care – PPO | Source: Home / Self Care | Attending: Emergency Medicine | Admitting: Emergency Medicine

## 2012-10-06 DIAGNOSIS — M549 Dorsalgia, unspecified: Secondary | ICD-10-CM

## 2012-10-06 DIAGNOSIS — M533 Sacrococcygeal disorders, not elsewhere classified: Secondary | ICD-10-CM

## 2012-10-06 MED ORDER — NAPROXEN 500 MG PO TABS: 500.0000 mg | ORAL_TABLET | Freq: Two times a day (BID) | ORAL | Status: DC

## 2012-10-06 MED ORDER — MELOXICAM 15 MG PO TABS: 15.0000 mg | ORAL_TABLET | Freq: Every day | ORAL | Status: DC

## 2012-10-06 MED ORDER — TRAMADOL HCL 50 MG PO TABS: 50.0000 mg | ORAL_TABLET | Freq: Four times a day (QID) | ORAL | Status: DC | PRN

## 2012-10-06 NOTE — ED Provider Notes (Signed)
Medical screening examination/treatment/procedure(s) were performed by non-physician practitioner and as supervising physician I was immediately available for consultation/collaboration.  Leslee Home, M.D.  Reuben Likes, MD 10/06/12 2136

## 2012-10-06 NOTE — ED Provider Notes (Signed)
CSN: [id number]     Arrival date & time 10/06/12  1808 History   First MD Initiated Contact with Patient 10/06/12 1921     Chief Complaint  Patient presents with  . Back Pain   (Consider location/radiation/quality/duration/timing/severity/associated sxs/prior Treatment) HPI Comments: 62 year old female presents complaining of back pain percent smoothly a large recliner in her home 2 weeks ago. She did not have immediate pain after pushing a recliner, but a few hours later she started to have soreness in her left lower side of her back. This got better gradually over one week and she thought she would be fine, but a few days ago the pain returned. The pain is still located in the lower left back and now radiates down the back of her left leg and then around to the top of her left foot. He feels this as a tingling sensation. It is not constant and she definitely has times where there is no pain. It is worse when she first gets up in the morning and when she tries to lay down at night. She denies any numbness or loss of bowel or bladder control. She denies any previous history of back pain. She has been taking Advil for the pain as needed which does help significantly.  Patient is a 62 y.o. female presenting with back pain.  Back Pain Associated symptoms: no abdominal pain, no chest pain, no dysuria, no fever, no numbness and no weakness     Past Medical History  Diagnosis Date  . Hamartoma     [place]   Past Surgical History  Procedure Laterality Date  . Salpingectomy      RIGHT/FOR ECTOPIC PREGNANCY  . Excision of lipoma    . Tonsillectomy    . Oophorectomy    . Vaginal hysterectomy  03/2003    LAVH, BSO  . Tubal ligation     Family History  Problem Relation Age of Onset  . Heart disease Daughter     DIED AT 9Y0 WITH ENDOCARDITIS  . Diabetes Mother   . Cancer Father     Mesothelioma  . Diabetes Maternal Aunt   . Ovarian cancer Maternal Aunt   . Heart failure Maternal Grandmother    . Hypertension Maternal Grandmother   . Thyroid disease Maternal Grandmother    History  Substance Use Topics  . Smoking status: Never Smoker   . Smokeless tobacco: Not on file  . Alcohol Use: No   OB History   Grav Para Term Preterm Abortions TAB SAB Ect Mult Living   6 5 5  1   1  4      Review of Systems  Constitutional: Negative for fever and chills.  Eyes: Negative for visual disturbance.  Respiratory: Negative for cough and shortness of breath.   Cardiovascular: Negative for chest pain, palpitations and leg swelling.  Gastrointestinal: Negative for nausea, vomiting and abdominal pain.  Endocrine: Negative for polydipsia and polyuria.  Genitourinary: Negative for dysuria, urgency and frequency.  Musculoskeletal: Positive for back pain. Negative for myalgias and arthralgias.  Skin: Negative for rash.  Neurological: Negative for dizziness, weakness, light-headedness and numbness.    Allergies  Review of patient's allergies indicates no known allergies.  Home Medications   Current Outpatient Rx  Name  Route  Sig  Dispense  Refill  . chlorpheniramine-HYDROcodone (TUSSIONEX) 10-8 MG/5ML LQCR   Oral   Take 5 mLs by mouth every 12 (twelve) hours as needed.   140 mL   0   .  Ibuprofen (ADVIL) 200 MG CAPS   Oral   Take 400 mg by mouth every 6 (six) hours as needed. Pain.         . meloxicam (MOBIC) 15 MG tablet   Oral   Take 1 tablet (15 mg total) by mouth daily.   30 tablet   0   . traMADol (ULTRAM) 50 MG tablet   Oral   Take 1 tablet (50 mg total) by mouth every 6 (six) hours as needed for pain.   30 tablet   0    BP 136/62  Pulse 67  Temp(Src) 99 F (37.2 C) (Oral)  Resp 16  SpO2 99% Physical Exam  Nursing note and vitals reviewed. Constitutional: She is oriented to person, place, and time. Vital signs are normal. She appears well-developed and well-nourished. No distress.  HENT:  Head: Normocephalic and atraumatic.  Pulmonary/Chest: Effort  normal. No respiratory distress.  Musculoskeletal:       Lumbar back: She exhibits tenderness (left SI joint only). She exhibits normal range of motion, no bony tenderness, no swelling, no edema, no deformity, no pain and no spasm.  Neurological: She is alert and oriented to person, place, and time. She has normal strength. Coordination normal.  Skin: Skin is warm and dry. No rash noted. She is not diaphoretic.  Psychiatric: She has a normal mood and affect. Judgment normal.    ED Course  Procedures (including critical care time) Labs Review Labs Reviewed - No data to display Imaging Review No results found.  MDM   1. Back pain   2. SI (sacroiliac) joint dysfunction    SI joint pain/strain. Treat with Mobic once daily and tramadol as needed. Followup with primary care physician if not improving for possible referral to physical therapy   Meds ordered this encounter  Medications  . traMADol (ULTRAM) 50 MG tablet    Sig: Take 1 tablet (50 mg total) by mouth every 6 (six) hours as needed for pain.    Dispense:  30 tablet    Refill:  0  . meloxicam (MOBIC) 15 MG tablet    Sig: Take 1 tablet (15 mg total) by mouth daily.    Dispense:  30 tablet    Refill:  0       Graylon Good, PA-C 10/06/12 1947

## 2012-10-06 NOTE — ED Notes (Signed)
Patient reports moving furniture 2 weeks ago.  While pushing a chair, felt pain in left lower back.  Pain seemed to improve over time, but never completely went away.  This past Monday patient reports back pain worsened to include left leg.  Pain in leg was "cramp" like.  Pain would start in left posterior thigh, side of thigh and rotate around left leg to front of lower leg and top of foot.

## 2013-09-28 ENCOUNTER — Encounter: Payer: Self-pay | Admitting: Internal Medicine

## 2013-11-05 ENCOUNTER — Ambulatory Visit (INDEPENDENT_AMBULATORY_CARE_PROVIDER_SITE_OTHER): Payer: BC Managed Care – PPO | Admitting: Physician Assistant

## 2013-11-05 ENCOUNTER — Ambulatory Visit: Payer: BC Managed Care – PPO

## 2013-11-05 VITALS — BP 132/76 | HR 69 | Temp 97.4°F | Resp 16 | Ht 64.0 in | Wt 144.0 lb

## 2013-11-05 DIAGNOSIS — Z7189 Other specified counseling: Secondary | ICD-10-CM

## 2013-11-05 DIAGNOSIS — Z7689 Persons encountering health services in other specified circumstances: Secondary | ICD-10-CM

## 2013-11-05 DIAGNOSIS — R7401 Elevation of levels of liver transaminase levels: Secondary | ICD-10-CM

## 2013-11-05 DIAGNOSIS — D170 Benign lipomatous neoplasm of skin and subcutaneous tissue of head, face and neck: Secondary | ICD-10-CM | POA: Insufficient documentation

## 2013-11-05 DIAGNOSIS — K7689 Other specified diseases of liver: Secondary | ICD-10-CM | POA: Insufficient documentation

## 2013-11-05 DIAGNOSIS — Z23 Encounter for immunization: Secondary | ICD-10-CM

## 2013-11-05 DIAGNOSIS — K760 Fatty (change of) liver, not elsewhere classified: Secondary | ICD-10-CM | POA: Insufficient documentation

## 2013-11-05 DIAGNOSIS — N1330 Unspecified hydronephrosis: Secondary | ICD-10-CM

## 2013-11-05 DIAGNOSIS — K8020 Calculus of gallbladder without cholecystitis without obstruction: Secondary | ICD-10-CM | POA: Insufficient documentation

## 2013-11-05 DIAGNOSIS — R9431 Abnormal electrocardiogram [ECG] [EKG]: Secondary | ICD-10-CM

## 2013-11-05 DIAGNOSIS — R740 Nonspecific elevation of levels of transaminase and lactic acid dehydrogenase [LDH]: Secondary | ICD-10-CM

## 2013-11-05 LAB — COMPREHENSIVE METABOLIC PANEL
ALBUMIN: 4.6 g/dL (ref 3.5–5.2)
ALK PHOS: 67 U/L (ref 39–117)
ALT: 77 U/L — AB (ref 0–35)
AST: 68 U/L — AB (ref 0–37)
BUN: 9 mg/dL (ref 6–23)
CALCIUM: 9.7 mg/dL (ref 8.4–10.5)
CHLORIDE: 102 meq/L (ref 96–112)
CO2: 25 mEq/L (ref 19–32)
Creat: 0.94 mg/dL (ref 0.50–1.10)
Glucose, Bld: 98 mg/dL (ref 70–99)
POTASSIUM: 4 meq/L (ref 3.5–5.3)
SODIUM: 140 meq/L (ref 135–145)
TOTAL PROTEIN: 7.9 g/dL (ref 6.0–8.3)
Total Bilirubin: 0.5 mg/dL (ref 0.2–1.2)

## 2013-11-05 LAB — LIPID PANEL
Cholesterol: 180 mg/dL (ref 0–200)
HDL: 46 mg/dL (ref >39)
LDL CALC: 111 mg/dL — AB (ref 0–99)
Total CHOL/HDL Ratio: 3.9 Ratio
Triglycerides: 113 mg/dL (ref <150)
VLDL: 23 mg/dL (ref 0–40)

## 2013-11-05 NOTE — Progress Notes (Signed)
Subjective:    Patient ID: Stacy Schultz, female    DOB: Mar 23, 1950, 63 y.o.   MRN: [id number]   PCP: formerly followed by Unice Cobble, MD  Chief Complaint  Patient presents with  . Establish Care     Active Ambulatory Problems    Diagnosis Date Noted  . HYPERLIPIDEMIA, MIXED 10/13/2006  . PALPITATIONS 10/13/2006  . ABNORMAL ELECTROCARDIOGRAM 10/13/2006  . Transaminitis 05/23/2011  . Hydronephrosis of left kidney 05/23/2011  . Gallstones 11/05/2013  . Hepatic cyst 11/05/2013  . Steatosis of liver 11/05/2013  . Lipoma of neck 11/05/2013   Resolved Ambulatory Problems    Diagnosis Date Noted  . MITRAL VALVE PROLAPSE 12/13/2007  . MVP (mitral valve prolapse)   . URTI (acute upper respiratory infection) 05/23/2011  . SOB (shortness of breath) 05/23/2011   Past Medical History  Diagnosis Date  . Hamartoma   . Cholelithiasis     Past Surgical History  Procedure Laterality Date  . Salpingectomy      RIGHT/FOR ECTOPIC PREGNANCY  . Excision of lipoma    . Tonsillectomy    . Oophorectomy    . Vaginal hysterectomy  03/2003    LAVH, BSO  . Tubal ligation      No Known Allergies  Prior to Admission medications   Medication Sig Start Date End Date Taking? Authorizing Provider  Ibuprofen (ADVIL) 200 MG CAPS Take 400 mg by mouth every 6 (six) hours as needed. Pain.   Yes Historical Provider, MD    History   Social History  . Marital Status: Divorced    Spouse Name: n/a    Number of Children: 5  . Years of Education: College   Occupational History  . Psychologist, prison and probation services   Social History Main Topics  . Smoking status: Never Smoker   . Smokeless tobacco: Never Used  . Alcohol Use: No  . Drug Use: No  . Sexual Activity: Not Currently    Birth Control/ Protection: Surgical   Other Topics Concern  . None   Social History Narrative   Lives alone. Sons live nearby. Cared for her mother in her home until the  mother's death in 2012/05/02.     Family History  Problem Relation Age of Onset  . Heart disease Daughter     DIED AT 35Y0 WITH ENDOCARDITIS  . Diabetes Mother   . Hypertension Mother   . Kidney disease Mother   . Cancer Father     Mesothelioma  . Diabetes Maternal Aunt   . Ovarian cancer Maternal Aunt   . Heart failure Maternal Grandmother   . Hypertension Maternal Grandmother   . Thyroid disease Maternal Grandmother   . Diabetes Maternal Grandmother   . Diabetes Sister   . Stroke Paternal Grandmother     HPI  This patient presents to establish for primary care in the face of her former PCP's retirement. She reports only ever seeing him when she was ill, but has decided that since she is now 55, she needs to have a "check up."  Her electronic record is reviewed with her in detail, and updated. She was unaware of the diagnosis of hyperlipidemia and the recommendation for CT follow-up of the hydronephrosis found incidentally during 05/2011 evaluation of a respiratory infection. She was also unaware of hepatic cyst, steatosis and transaminitis.  During her 3rd (of 6) pregnancy, she was told she had MVP, and for years has taken SBE prophylaxis prior to  dental cleaning, but during cardiology evaluation 05/2011, she was told she does NOT have MVP, and does NOT need SBE.   Colonoscopy about 10 years ago. Mammogram 12/2012 at [facility]. Declines influenza, zoster and pneumococcal vaccinations.  Review of Systems Asymptomatic. No concerns. "I thought I was healthy until I came in here today!"    Objective:   Physical Exam  Vitals reviewed. Constitutional: She is oriented to person, place, and time. Vital signs are normal. She appears well-developed and well-nourished. She is active and cooperative. No distress.  BP 132/76  Pulse 69  Temp(Src) 97.4 F (36.3 C) (Oral)  Resp 16  Ht 5\' 4"  (1.626 m)  Wt 144 lb (65.318 kg)  BMI 24.71 kg/m2  SpO2 96%  HENT:  Head: Normocephalic and  atraumatic.  Right Ear: Hearing normal.  Left Ear: Hearing normal.  Eyes: Conjunctivae are normal. No scleral icterus.  Neck: Normal range of motion. Neck supple. No thyromegaly present.  Cardiovascular: Normal rate, regular rhythm and normal heart sounds.   Pulses:      Radial pulses are 2+ on the right side, and 2+ on the left side.  Pulmonary/Chest: Effort normal and breath sounds normal.  Lymphadenopathy:       Head (right side): No tonsillar, no preauricular, no posterior auricular and no occipital adenopathy present.       Head (left side): No tonsillar, no preauricular, no posterior auricular and no occipital adenopathy present.    She has no cervical adenopathy.       Right: No supraclavicular adenopathy present.       Left: No supraclavicular adenopathy present.  Neurological: She is alert and oriented to person, place, and time. No sensory deficit.  Skin: Skin is warm, dry and intact. No rash noted. No cyanosis or erythema. Nails show no clubbing.     Psychiatric: She has a normal mood and affect.          Assessment & Plan:  1. Encounter to establish care Declined vaccines: influenza, zoster, pneumococcal, despite urging.  2. Gallstones Asymptomatic. No additional evaluation or treatment indicated at this time.  3. Hepatic cyst 4. Steatosis of liver 5. Transaminitis Await lab results. Treat lipids if indicated. CT to assess hydronephrosis will assess stability of liver findings. - Comprehensive metabolic panel - Lipid panel  6. Hydronephrosis of left kidney Proceed with recommended CT with delayed imaging of the pelvis. - CT Abdomen Pelvis W Contrast; Future  7. Need for Tdap vaccination - Tdap vaccine greater than or equal to 7yo IM  Re-evaluate in 6-12 months, sooner if needed.  Fara Chute, PA-C Physician Assistant-Certified [facility]

## 2013-11-05 NOTE — Patient Instructions (Signed)
I will contact you with your lab results as soon as they are available.   If you have not heard from me in 2 weeks, please contact me.  The fastest way to get your results is to register for My Chart (see the instructions on the last page of this printout).   

## 2013-11-14 ENCOUNTER — Other Ambulatory Visit: Payer: BC Managed Care – PPO

## 2014-01-11 HISTORY — PX: LIVER BIOPSY: SHX301

## 2014-01-25 ENCOUNTER — Encounter: Payer: Self-pay | Admitting: Nurse Practitioner

## 2014-01-25 ENCOUNTER — Telehealth: Payer: Self-pay | Admitting: *Deleted

## 2014-01-25 ENCOUNTER — Encounter: Payer: Self-pay | Admitting: Women's Health

## 2014-01-25 ENCOUNTER — Ambulatory Visit (INDEPENDENT_AMBULATORY_CARE_PROVIDER_SITE_OTHER): Payer: BLUE CROSS/BLUE SHIELD | Admitting: Women's Health

## 2014-01-25 VITALS — BP 128/80 | Ht 64.0 in | Wt 143.0 lb

## 2014-01-25 DIAGNOSIS — Z780 Asymptomatic menopausal state: Secondary | ICD-10-CM

## 2014-01-25 DIAGNOSIS — Z01419 Encounter for gynecological examination (general) (routine) without abnormal findings: Secondary | ICD-10-CM

## 2014-01-25 DIAGNOSIS — R1030 Lower abdominal pain, unspecified: Secondary | ICD-10-CM

## 2014-01-25 DIAGNOSIS — K921 Melena: Secondary | ICD-10-CM

## 2014-01-25 LAB — CBC WITH DIFFERENTIAL/PLATELET
BASOS ABS: 0 10*3/uL (ref 0.0–0.1)
Basophils Relative: 0 % (ref 0–1)
EOS ABS: 0.1 10*3/uL (ref 0.0–0.7)
Eosinophils Relative: 1 % (ref 0–5)
HEMATOCRIT: 43.7 % (ref 36.0–46.0)
HEMOGLOBIN: 15 g/dL (ref 12.0–15.0)
LYMPHS ABS: 1.9 10*3/uL (ref 0.7–4.0)
Lymphocytes Relative: 20 % (ref 12–46)
MCH: 29.4 pg (ref 26.0–34.0)
MCHC: 34.3 g/dL (ref 30.0–36.0)
MCV: 85.7 fL (ref 78.0–100.0)
MONO ABS: 0.8 10*3/uL (ref 0.1–1.0)
MPV: 11.1 fL (ref 8.6–12.4)
Monocytes Relative: 8 % (ref 3–12)
Neutro Abs: 6.9 10*3/uL (ref 1.7–7.7)
Neutrophils Relative %: 71 % (ref 43–77)
PLATELETS: 228 10*3/uL (ref 150–400)
RBC: 5.1 MIL/uL (ref 3.87–5.11)
RDW: 13.2 % (ref 11.5–15.5)
WBC: 9.7 10*3/uL (ref 4.0–10.5)

## 2014-01-25 NOTE — Progress Notes (Signed)
Stacy Schultz [birth year] [id number]    History:    Presents for annual exam.  2005 LAVH with BSO for dysfunctional uterine bleeding. 2009 colonoscopy no polyps, diverticulosis. 2012 DEXA T score -1.3 left femoral neck FRAX 7.8%/0.6%. Recent T dap. Normal mammogram history. For the last 3 days has had low abdominal pain or cramping, bloody stool, and has not felt well. Pain mostly on left lower quadrant. Has elevated liver enzymes primary care following up with.  Past medical history, past surgical history, family history and social history were all reviewed and documented in the EPIC chart. Works at the [employer]. 5 children 1 daughter died age 52 from endocarditis, 4 living children all doing well.  ROS:  A ROS was performed and pertinent positives and negatives are included.  Exam:  Filed Vitals:   01/25/14 0802  BP: 128/80    General appearance:  Normal Thyroid:  Symmetrical, normal in size, without palpable masses or nodularity. Respiratory  Auscultation:  Clear without wheezing or rhonchi Cardiovascular  Auscultation:  Regular rate, without rubs, murmurs or gallops  Edema/varicosities:  Not grossly evident Abdominal  Soft,nontender, without masses, guarding or rebound.  Liver/spleen:  No organomegaly noted  Hernia:  None appreciated  Skin  Inspection:  Grossly normal   Breasts: Examined lying and sitting.     Right: Without masses, retractions, discharge or axillary adenopathy.     Left: Without masses, retractions, discharge or axillary adenopathy. Gentitourinary   Inguinal/mons:  Normal without inguinal adenopathy  External genitalia:  Normal  BUS/Urethra/Skene's glands:  Normal  Vagina:  Normal  Cervix:  And uterus absent  Adnexa/parametria:     Rt: Without masses or tenderness.   Lt: Without masses or tenderness.  Anus and perineum: Normal  Digital rectal exam: Normal sphincter tone without palpated masses or tenderness  Assessment/Plan:  64 y.o.  DW F G5 P4 for annual exam.    Recent onset left lower quadrant pain with bloody stools. 2005 LAVH with BSO for DUB on no HRT Elevated liver enzymes primary care following  Osteopenia without elevated FRAX  Plan: Referral to Dr. Carlean Purl for low abdominal pain and blood in stools. SBE's, continue annual mammogram, 3-D tomography reviewed and encouraged history of dense breasts. CBC, vitamin D, UA, Pap normal, new screening guidelines reviewed. Encouraged Zostavax and Pneumovax. Home safety, fall prevention discussed. Repeat DEXA will schedule.     Huel Cote [facility], 9:43 AM 01/25/2014

## 2014-01-25 NOTE — Telephone Encounter (Signed)
-----   Message from Huel Cote, NP sent at 01/25/2014  8:28 AM EST ----- Needs appt ASAP with Dr Carlean Purl for lower left abd pain and bloody stools for past 3 days.  Enjoys good health, no meds.  occas advil. abd very tender with rebound.  No vomiting or diarrhea.  Last colon 09, diverticulosis.  thanks

## 2014-01-25 NOTE — Patient Instructions (Signed)
Health Recommendations for Postmenopausal Women Respected and ongoing research has looked at the most common causes of death, disability, and poor quality of life in postmenopausal women. The causes include heart disease, diseases of blood vessels, diabetes, depression, cancer, and bone loss (osteoporosis). Many things can be done to help lower the chances of developing these and other common problems. CARDIOVASCULAR DISEASE Heart Disease: A heart attack is a medical emergency. Know the signs and symptoms of a heart attack. Below are things women can do to reduce their risk for heart disease.   Do not smoke. If you smoke, quit.  Aim for a healthy weight. Being overweight causes many preventable deaths. Eat a healthy and balanced diet and drink an adequate amount of liquids.  Get moving. Make a commitment to be more physically active. Aim for 30 minutes of activity on most, if not all days of the week.  Eat for heart health. Choose a diet that is low in saturated fat and cholesterol and eliminate trans fat. Include whole grains, vegetables, and fruits. Read and understand the labels on food containers before buying.  Know your numbers. Ask your caregiver to check your blood pressure, cholesterol (total, HDL, LDL, triglycerides) and blood glucose. Work with your caregiver on improving your entire clinical picture.  High blood pressure. Limit or stop your table salt intake (try salt substitute and food seasonings). Avoid salty foods and drinks. Read labels on food containers before buying. Eating well and exercising can help control high blood pressure. STROKE  Stroke is a medical emergency. Stroke may be the result of a blood clot in a blood vessel in the brain or by a brain hemorrhage (bleeding). Know the signs and symptoms of a stroke. To lower the risk of developing a stroke:  Avoid fatty foods.  Quit smoking.  Control your diabetes, blood pressure, and irregular heart rate. THROMBOPHLEBITIS  (BLOOD CLOT) OF THE LEG  Becoming overweight and leading a stationary lifestyle may also contribute to developing blood clots. Controlling your diet and exercising will help lower the risk of developing blood clots. CANCER SCREENING  Breast Cancer: Take steps to reduce your risk of breast cancer.  You should practice "breast self-awareness." This means understanding the normal appearance and feel of your breasts and should include breast self-examination. Any changes detected, no matter how small, should be reported to your caregiver.  After age 40, you should have a clinical breast exam (CBE) every year.  Starting at age 40, you should consider having a mammogram (breast X-ray) every year.  If you have a family history of breast cancer, talk to your caregiver about genetic screening.  If you are at high risk for breast cancer, talk to your caregiver about having an MRI and a mammogram every year.  Intestinal or Stomach Cancer: Tests to consider are a rectal exam, fecal occult blood, sigmoidoscopy, and colonoscopy. Women who are high risk may need to be screened at an earlier age and more often.  Cervical Cancer:  Beginning at age 30, you should have a Pap test every 3 years as long as the past 3 Pap tests have been normal.  If you have had past treatment for cervical cancer or a condition that could lead to cancer, you need Pap tests and screening for cancer for at least 20 years after your treatment.  If you had a hysterectomy for a problem that was not cancer or a condition that could lead to cancer, then you no longer need Pap tests.    If you are between ages 65 and 70, and you have had normal Pap tests going back 10 years, you no longer need Pap tests.  If Pap tests have been discontinued, risk factors (such as a new sexual partner) need to be reassessed to determine if screening should be resumed.  Some medical problems can increase the chance of getting cervical cancer. In these  cases, your caregiver may recommend more frequent screening and Pap tests.  Uterine Cancer: If you have vaginal bleeding after reaching menopause, you should notify your caregiver.  Ovarian Cancer: Other than yearly pelvic exams, there are no reliable tests available to screen for ovarian cancer at this time except for yearly pelvic exams.  Lung Cancer: Yearly chest X-rays can detect lung cancer and should be done on high risk women, such as cigarette smokers and women with chronic lung disease (emphysema).  Skin Cancer: A complete body skin exam should be done at your yearly examination. Avoid overexposure to the sun and ultraviolet light lamps. Use a strong sun block cream when in the sun. All of these things are important for lowering the risk of skin cancer. MENOPAUSE Menopause Symptoms: Hormone therapy products are effective for treating symptoms associated with menopause:  Moderate to severe hot flashes.  Night sweats.  Mood swings.  Headaches.  Tiredness.  Loss of sex drive.  Insomnia.  Other symptoms. Hormone replacement carries certain risks, especially in older women. Women who use or are thinking about using estrogen or estrogen with progestin treatments should discuss that with their caregiver. Your caregiver will help you understand the benefits and risks. The ideal dose of hormone replacement therapy is not known. The Food and Drug Administration (FDA) has concluded that hormone therapy should be used only at the lowest doses and for the shortest amount of time to reach treatment goals.  OSTEOPOROSIS Protecting Against Bone Loss and Preventing Fracture If you use hormone therapy for prevention of bone loss (osteoporosis), the risks for bone loss must outweigh the risk of the therapy. Ask your caregiver about other medications known to be safe and effective for preventing bone loss and fractures. To guard against bone loss or fractures, the following is recommended:  If  you are younger than age 50, take 1000 mg of calcium and at least 600 mg of Vitamin D per day.  If you are older than age 50 but younger than age 70, take 1200 mg of calcium and at least 600 mg of Vitamin D per day.  If you are older than age 70, take 1200 mg of calcium and at least 800 mg of Vitamin D per day. Smoking and excessive alcohol intake increases the risk of osteoporosis. Eat foods rich in calcium and vitamin D and do weight bearing exercises several times a week as your caregiver suggests. DIABETES Diabetes Mellitus: If you have type I or type 2 diabetes, you should keep your blood sugar under control with diet, exercise, and recommended medication. Avoid starchy and fatty foods, and too many sweets. Being overweight can make diabetes control more difficult. COGNITION AND MEMORY Cognition and Memory: Menopausal hormone therapy is not recommended for the prevention of cognitive disorders such as Alzheimer's disease or memory loss.  DEPRESSION  Depression may occur at any age, but it is common in elderly women. This may be because of physical, medical, social (loneliness), or financial problems and needs. If you are experiencing depression because of medical problems and control of symptoms, talk to your caregiver about this. Physical   activity and exercise may help with mood and sleep. Community and volunteer involvement may improve your sense of value and worth. If you have depression and you feel that the problem is getting worse or becoming severe, talk to your caregiver about which treatment options are best for you. ACCIDENTS  Accidents are common and can be serious in elderly woman. Prepare your house to prevent accidents. Eliminate throw rugs, place hand bars in bath, shower, and toilet areas. Avoid wearing high heeled shoes or walking on wet, snowy, and icy areas. Limit or stop driving if you have vision or hearing problems, or if you feel you are unsteady with your movements and  reflexes. HEPATITIS C Hepatitis C is a type of viral infection affecting the liver. It is spread mainly through contact with blood from an infected person. It can be treated, but if left untreated, it can lead to severe liver damage over the years. Many people who are infected do not know that the virus is in their blood. If you are a "baby-boomer", it is recommended that you have one screening test for Hepatitis C. IMMUNIZATIONS  Several immunizations are important to consider having during your senior years, including:   Tetanus, diphtheria, and pertussis booster shot.  Influenza every year before the flu season begins.  Pneumonia vaccine.  Shingles vaccine.  Others, as indicated based on your specific needs. Talk to your caregiver about these. Document Released: 02/19/2005 Document Revised: 05/14/2013 Document Reviewed: 10/16/2007 Exit[employer] Patient Information 2015 [employer]. This information is not intended to replace advice given to you by your health care provider. Make sure you discuss any questions you have with your health care provider.  

## 2014-01-25 NOTE — Telephone Encounter (Signed)
Dr.Gessner next appointment would be in March, Pt can be seen by PA. On 01/29/14 @ 8:30am with Tye Savoy. Pt informed.

## 2014-01-26 LAB — URINALYSIS W MICROSCOPIC + REFLEX CULTURE
BACTERIA UA: NONE SEEN
CRYSTALS: NONE SEEN
Casts: NONE SEEN
Glucose, UA: NEGATIVE mg/dL
HGB URINE DIPSTICK: NEGATIVE
NITRITE: NEGATIVE
PH: 5.5 (ref 5.0–8.0)
Protein, ur: 30 mg/dL — AB
Specific Gravity, Urine: 1.024 (ref 1.005–1.030)
Squamous Epithelial / LPF: NONE SEEN
Urobilinogen, UA: 1 mg/dL (ref 0.0–1.0)

## 2014-01-26 LAB — VITAMIN D 25 HYDROXY (VIT D DEFICIENCY, FRACTURES): Vit D, 25-Hydroxy: 15 ng/mL — ABNORMAL LOW (ref 30–100)

## 2014-01-27 LAB — URINE CULTURE: Colony Count: 2000

## 2014-01-28 ENCOUNTER — Other Ambulatory Visit: Payer: Self-pay | Admitting: Women's Health

## 2014-01-28 DIAGNOSIS — E559 Vitamin D deficiency, unspecified: Secondary | ICD-10-CM

## 2014-01-28 MED ORDER — VITAMIN D (ERGOCALCIFEROL) 1.25 MG (50000 UNIT) PO CAPS: 50000.0000 [IU] | ORAL_CAPSULE | ORAL | Status: DC

## 2014-01-29 ENCOUNTER — Encounter: Payer: Self-pay | Admitting: Nurse Practitioner

## 2014-01-29 ENCOUNTER — Ambulatory Visit (INDEPENDENT_AMBULATORY_CARE_PROVIDER_SITE_OTHER): Payer: BLUE CROSS/BLUE SHIELD | Admitting: Nurse Practitioner

## 2014-01-29 ENCOUNTER — Other Ambulatory Visit (INDEPENDENT_AMBULATORY_CARE_PROVIDER_SITE_OTHER): Payer: BLUE CROSS/BLUE SHIELD

## 2014-01-29 VITALS — BP 128/84 | HR 72 | Ht 63.5 in | Wt 143.5 lb

## 2014-01-29 DIAGNOSIS — R7989 Other specified abnormal findings of blood chemistry: Secondary | ICD-10-CM

## 2014-01-29 DIAGNOSIS — K625 Hemorrhage of anus and rectum: Secondary | ICD-10-CM

## 2014-01-29 DIAGNOSIS — R1032 Left lower quadrant pain: Secondary | ICD-10-CM

## 2014-01-29 DIAGNOSIS — R945 Abnormal results of liver function studies: Secondary | ICD-10-CM

## 2014-01-29 DIAGNOSIS — R7401 Elevation of levels of liver transaminase levels: Secondary | ICD-10-CM

## 2014-01-29 DIAGNOSIS — R740 Nonspecific elevation of levels of transaminase and lactic acid dehydrogenase [LDH]: Secondary | ICD-10-CM

## 2014-01-29 DIAGNOSIS — R1030 Lower abdominal pain, unspecified: Secondary | ICD-10-CM | POA: Insufficient documentation

## 2014-01-29 LAB — COMPREHENSIVE METABOLIC PANEL
ALBUMIN: 4.3 g/dL (ref 3.5–5.2)
ALK PHOS: 72 U/L (ref 39–117)
ALT: 88 U/L — AB (ref 0–35)
AST: 93 U/L — AB (ref 0–37)
BILIRUBIN TOTAL: 0.3 mg/dL (ref 0.2–1.2)
BUN: 11 mg/dL (ref 6–23)
CALCIUM: 10 mg/dL (ref 8.4–10.5)
CHLORIDE: 103 meq/L (ref 96–112)
CO2: 28 mEq/L (ref 19–32)
CREATININE: 1.01 mg/dL (ref 0.40–1.20)
GFR: 58.42 mL/min — AB (ref >60.00)
GLUCOSE: 110 mg/dL — AB (ref 70–99)
Potassium: 4.2 mEq/L (ref 3.5–5.1)
Sodium: 139 mEq/L (ref 135–145)
TOTAL PROTEIN: 8 g/dL (ref 6.0–8.3)

## 2014-01-29 NOTE — Progress Notes (Signed)
HPI :  Patient is a 64 year old female known remotely to Dr. Carlean Purl from colonoscopy in 2009. She is referred by gynecology for evaluation of abdominal pain and rectal bleeding. Last Wednesday patient developed lower abdominal discomfort. On that same day she had 3 bowel movements of normal consistency ( normally has a bowel movement every other day so this was unusual). Lower abdominal pain worsened, it hurts to sneeze or move about. No bowel further BMs until Friday when she had a BM containing a small to moderate amount of blood. Patient went for routine gynecologic exam on Friday , noted to be tender in her lower abdomen. CBC was unremarkable. Pain has been improving over the last couple of days, it has almost resolved. She had a normal bowel movement on Saturday as well as this am. Patient never did have any associated nausea or vomiting. No fevers.  Past Medical History  Diagnosis Date  . Hamartoma     [place]  . Cholelithiasis   . Diverticulosis     Family History  Problem Relation Age of Onset  . Heart disease Daughter     DIED AT 55Y0 WITH ENDOCARDITIS  . Diabetes Mother   . Hypertension Mother   . Kidney disease Mother   . Cancer Father     Mesothelioma  . Diabetes Maternal Aunt   . Ovarian cancer Maternal Aunt   . Heart failure Maternal Grandmother   . Hypertension Maternal Grandmother   . Thyroid disease Maternal Grandmother   . Diabetes Maternal Grandmother   . Diabetes Sister   . Stroke Paternal Grandmother   . Colon cancer Neg Hx   . Colon polyps Neg Hx   . Gallbladder disease Neg Hx   . Esophageal cancer Neg Hx    History  Substance Use Topics  . Smoking status: Never Smoker   . Smokeless tobacco: Never Used  . Alcohol Use: No   Current Outpatient Prescriptions  Medication Sig Dispense Refill  . Ibuprofen (ADVIL) 200 MG CAPS Take 400 mg by mouth every 6 (six) hours as needed. Pain.    . Vitamin D, Ergocalciferol, (DRISDOL) 50000 UNITS CAPS capsule Take 1  capsule (50,000 Units total) by mouth every 7 (seven) days. 12 capsule 0   No current facility-administered medications for this visit.   No Known Allergies   Review of Systems: All systems reviewed and negative except where noted in HPI.   Physical Exam: BP 128/84 mmHg  Pulse 72  Ht 5' 3.5" (1.613 m)  Wt 143 lb 8 oz (65.091 kg)  BMI 25.02 kg/m2 Constitutional: Pleasant,well-developed, white female in no acute distress. HEENT: Normocephalic and atraumatic. Conjunctivae are normal. No scleral icterus. Neck supple.  Cardiovascular: Normal rate, regular rhythm.  Pulmonary/chest: Effort normal and breath sounds normal. No wheezing, rales or rhonchi. Abdominal: Soft, nondistended, mild-moderate epigastric tenderness. Mild to moderate LLQ and mid lower abdominal tenderness. Bowel sounds active throughout. There are no masses palpable. No hepatomegaly. Extremities: no edema Lymphadenopathy: No cervical adenopathy noted. Neurological: Alert and oriented to person place and time. Skin: Skin is warm and dry. No rashes noted. Psychiatric: Normal mood and affect. Behavior is normal.   ASSESSMENT AND PLAN:  70. 64 year old female with recent 3-4 days of lower abdominal discomfort and one episode of blood in stool. Her abdominal discomfort has resolved and BMs normal over last couple of day (no blood). Etiology unclear.   Doesn't seem diverticular.  hemorrhoidal bleeding would not explain the pain.   Ischemic colitis is  possible.     Since patient feels much better will hold off on                 imaging. CTscan if symptoms don't resolve or if they             worsen.    She is up to date on colon cancer screening. Last            colonoscopy 2009 - diverticulosis.   2. Abnormal liver function studies. AST 60 / ALT 77 in October 2015.Marland Kitchen No labs in 2014 but in 2013 transaminases in 150 range.  Ultrasound in 2013 showed cholelithiasis, probably steatosis..There was a cystic structure in  the medial segment of the left lobe of the liver measures 2.4 x 1.9 x 2.2 cm.  Will obtain repeat LFTs today.  HBV, HCV negative. Will obtain ANA, ASMA

## 2014-01-29 NOTE — Patient Instructions (Signed)
Go to the basement for labs today You will need to follow up with Tye Savoy in 4 weeks Please contact the office at [phone] in 2 weeks to schedule that appointment, Paulas schedule is not out yet as of today

## 2014-01-30 ENCOUNTER — Other Ambulatory Visit: Payer: Self-pay | Admitting: Gynecology

## 2014-01-30 DIAGNOSIS — Z780 Asymptomatic menopausal state: Secondary | ICD-10-CM

## 2014-01-30 LAB — ANA: ANA: NEGATIVE

## 2014-01-31 LAB — ANTI-SMOOTH MUSCLE ANTIBODY, IGG: Smooth Muscle Ab: 19 U (ref <20)

## 2014-01-31 NOTE — Progress Notes (Signed)
Agree with Ms. Guenther's assessment and plan. Briella Hobday E. Ryliegh Mcduffey, MD, FACG   

## 2014-02-14 ENCOUNTER — Other Ambulatory Visit: Payer: Self-pay | Admitting: Gynecology

## 2014-02-14 ENCOUNTER — Ambulatory Visit (INDEPENDENT_AMBULATORY_CARE_PROVIDER_SITE_OTHER): Payer: BLUE CROSS/BLUE SHIELD

## 2014-02-14 DIAGNOSIS — M8580 Other specified disorders of bone density and structure, unspecified site: Secondary | ICD-10-CM

## 2014-02-14 DIAGNOSIS — Z780 Asymptomatic menopausal state: Secondary | ICD-10-CM

## 2014-02-14 DIAGNOSIS — Z13820 Encounter for screening for osteoporosis: Secondary | ICD-10-CM

## 2014-02-18 ENCOUNTER — Encounter: Payer: Self-pay | Admitting: Internal Medicine

## 2014-02-26 ENCOUNTER — Ambulatory Visit (INDEPENDENT_AMBULATORY_CARE_PROVIDER_SITE_OTHER): Payer: BLUE CROSS/BLUE SHIELD | Admitting: Physician Assistant

## 2014-02-26 ENCOUNTER — Encounter (HOSPITAL_COMMUNITY): Payer: Self-pay

## 2014-02-26 ENCOUNTER — Encounter: Payer: Self-pay | Admitting: Physician Assistant

## 2014-02-26 ENCOUNTER — Ambulatory Visit (HOSPITAL_COMMUNITY)
Admission: RE | Admit: 2014-02-26 | Discharge: 2014-02-26 | Disposition: A | Discharge status: Home / Self Care | Payer: BLUE CROSS/BLUE SHIELD | Source: Ambulatory Visit | Attending: Physician Assistant | Admitting: Physician Assistant

## 2014-02-26 VITALS — BP 142/82 | HR 94 | Temp 98.1°F | Resp 16 | Ht 64.5 in | Wt 140.0 lb

## 2014-02-26 DIAGNOSIS — K760 Fatty (change of) liver, not elsewhere classified: Secondary | ICD-10-CM

## 2014-02-26 DIAGNOSIS — K8020 Calculus of gallbladder without cholecystitis without obstruction: Secondary | ICD-10-CM

## 2014-02-26 DIAGNOSIS — R1084 Generalized abdominal pain: Secondary | ICD-10-CM | POA: Insufficient documentation

## 2014-02-26 DIAGNOSIS — R7401 Elevation of levels of liver transaminase levels: Secondary | ICD-10-CM

## 2014-02-26 DIAGNOSIS — K7689 Other specified diseases of liver: Secondary | ICD-10-CM

## 2014-02-26 DIAGNOSIS — R740 Nonspecific elevation of levels of transaminase and lactic acid dehydrogenase [LDH]: Secondary | ICD-10-CM

## 2014-02-26 MED ORDER — IOHEXOL 300 MG/ML  SOLN
80.0000 mL | Freq: Once | INTRAMUSCULAR | Status: AC | PRN
  Administered 2014-02-26: 80 mL via INTRAVENOUS

## 2014-02-26 NOTE — Patient Instructions (Signed)
For your CT Scan you are to go over to [facility] 1st floor radiology.  Appt is at 3pm.  Please arrive 2 hours prior to your appt to drink contrast.

## 2014-02-26 NOTE — Progress Notes (Signed)
Subjective:    Patient ID: Stacy Schultz, female    DOB: 29-Feb-1950, 64 y.o.   MRN: [id number]   PCP: Emilea Goga, PA-C  Chief Complaint  Patient presents with  . Abdominal Pain    x 3 days    No Known Allergies  Patient Active Problem List   Diagnosis Date Noted  . Lower abdominal pain 01/29/2014  . Rectal bleeding 01/29/2014  . Gallstones 11/05/2013  . Hepatic cyst 11/05/2013  . Steatosis of liver 11/05/2013  . Lipoma of neck 11/05/2013  . Transaminitis 05/23/2011  . Hydronephrosis of left kidney 05/23/2011  . HYPERLIPIDEMIA, MIXED 10/13/2006  . PALPITATIONS 10/13/2006  . ABNORMAL ELECTROCARDIOGRAM 10/13/2006    Prior to Admission medications   Medication Sig Start Date End Date Taking? Authorizing Provider  Ibuprofen (ADVIL) 200 MG CAPS Take 400 mg by mouth every 6 (six) hours as needed. Pain.   Yes Historical Provider, MD  Vitamin D, Ergocalciferol, (DRISDOL) 50000 UNITS CAPS capsule Take 1 capsule (50,000 Units total) by mouth every 7 (seven) days. 01/28/14  Yes Terrance Mass, MD    Medical, Surgical, Family and Social History reviewed and updated.  HPI  Accompanied by her best friend,"Ms. Worm." Reports intermittent abdominal pain since October. Each episode associated with considerable fatigue/malaise but no nausea. Despite her friend's urging, she has put off evaluation.  She has known cholelithiasis and diverticulosis by imaging, never symptomatically previously. Found incidentally during evaluation of respiratory infection in 05/2011. Also noted then were a hepatic cyst, hepatic steatosis and LEFT hydronephrosis.  OBGYN appointment 01/25/14. Several days previously she had developed left lower abdominal pain. Had looser than normal stools, but not diarrhea and had 1 episode of BRBPR. UA and labs were normal except low vitamin D. Saw GI on 01/29/14, but symptoms had completely resolved. LFTs were elevated. Only abnormal exam finding was tenderness in the  epigastrum. Was to follow-up in 6 weeks regarding the elevated transaminases.  Pain recurred Sunday 02/24/2014 and is epigastric, not LLQ. Increased belching, quite violent, but no vomiting. No nausea. No regurgitation. Feels "zapped" of energy.  Reduced appetite. This morning had some stool urgency and loose stool. No urinary symptoms. No fever/chills.  Relates this discomfort to what she had with an ectopic pregnancy and resulting hemmorhage.  Review of Systems As above. Developed a dry cough upon arrival today. Denies any other URI-type symptoms, SOB, CP, HA, dizziness. No muscle or joint pain.    Objective:   Physical Exam  Constitutional: She is oriented to person, place, and time. Vital signs are normal. She appears well-developed and well-nourished. She is active and cooperative. No distress.  BP 142/82 mmHg  Pulse 94  Temp(Src) 98.1 F (36.7 C)  Resp 16  Ht 5' 4.5" (1.638 m)  Wt 140 lb (63.504 kg)  BMI 23.67 kg/m2  SpO2 96%  HENT:  Head: Normocephalic and atraumatic.  Right Ear: Hearing normal.  Left Ear: Hearing normal.  Eyes: Conjunctivae are normal. No scleral icterus.  Neck: Normal range of motion. Neck supple. No thyromegaly present.  Cardiovascular: Normal rate, regular rhythm and normal heart sounds.   Pulses:      Radial pulses are 2+ on the right side, and 2+ on the left side.  Pulmonary/Chest: Effort normal and breath sounds normal.  Abdominal: Soft. Bowel sounds are normal. She exhibits no distension and no mass. There is no hepatosplenomegaly. There is generalized tenderness (less tender in the LLQ, most tender in the epigastrum). There is no rigidity, no  rebound, no guarding, no CVA tenderness, no tenderness at McBurney's point and negative Murphy's sign.  Lymphadenopathy:       Head (right side): No tonsillar, no preauricular, no posterior auricular and no occipital adenopathy present.       Head (left side): No tonsillar, no preauricular, no posterior  auricular and no occipital adenopathy present.    She has no cervical adenopathy.       Right: No supraclavicular adenopathy present.       Left: No supraclavicular adenopathy present.  Neurological: She is alert and oriented to person, place, and time. No sensory deficit.  Skin: Skin is warm, dry and intact. No rash noted. No cyanosis or erythema. Nails show no clubbing.  Psychiatric: She has a normal mood and affect.          Assessment & Plan:  1. Generalized abdominal pain Proceed with CT scan now. Treat pending results. Given the intermittent nature and increased belching, consider PPI therapy. Certainly will need follow-up with GI (Guenther/Gessner). - CT Abdomen Pelvis W Contrast; Future  2. Gallstones 3. Steatosis of liver 4. Hepatic cyst 5. Transaminitis Anticipate more information about the stability of these issues with CT results.    Fara Chute, PA-C Physician Assistant-Certified [facility]

## 2014-02-27 ENCOUNTER — Telehealth: Payer: Self-pay | Admitting: Physician Assistant

## 2014-02-27 MED ORDER — PANTOPRAZOLE SODIUM 40 MG PO TBEC: 40.0000 mg | DELAYED_RELEASE_TABLET | Freq: Every day | ORAL | Status: DC

## 2014-02-27 NOTE — Telephone Encounter (Signed)
Feeling some better today. Still no appetite. When she eats, there's "gurgling" and she had some diarrhea this am. Pain is improved, still "discomfort."  "I forgot to mention yesterday, that on very rare occasion, I have trouble swallowing."  Relayed CT results. Advised to start PPI and follow-up with GI.    IMPRESSION: Question large vertebral hemangioma within L2 vertebra. Hepatic and LEFT renal cysts.  Mild RIGHT hydronephrosis without definite ureteral calcification or dilatation, question due to UPJ obstruction.  Calcified pleural plaques at lung bases which could be due to prior asbestos exposure.  Cholelithiasis.  Sigmoid diverticulosis.

## 2014-02-28 ENCOUNTER — Telehealth: Payer: Self-pay | Admitting: Internal Medicine

## 2014-02-28 NOTE — Telephone Encounter (Signed)
Patient is rescheduled to see Tye Savoy RNP 03/07/14

## 2014-03-07 ENCOUNTER — Other Ambulatory Visit (INDEPENDENT_AMBULATORY_CARE_PROVIDER_SITE_OTHER): Payer: BLUE CROSS/BLUE SHIELD

## 2014-03-07 ENCOUNTER — Encounter: Payer: Self-pay | Admitting: Nurse Practitioner

## 2014-03-07 ENCOUNTER — Ambulatory Visit (INDEPENDENT_AMBULATORY_CARE_PROVIDER_SITE_OTHER): Payer: BLUE CROSS/BLUE SHIELD | Admitting: Nurse Practitioner

## 2014-03-07 VITALS — BP 120/70 | HR 68 | Ht 64.0 in | Wt 142.0 lb

## 2014-03-07 DIAGNOSIS — R1030 Lower abdominal pain, unspecified: Secondary | ICD-10-CM

## 2014-03-07 DIAGNOSIS — R7401 Elevation of levels of liver transaminase levels: Secondary | ICD-10-CM

## 2014-03-07 DIAGNOSIS — R194 Change in bowel habit: Secondary | ICD-10-CM

## 2014-03-07 DIAGNOSIS — R945 Abnormal results of liver function studies: Principal | ICD-10-CM

## 2014-03-07 DIAGNOSIS — R7989 Other specified abnormal findings of blood chemistry: Secondary | ICD-10-CM

## 2014-03-07 DIAGNOSIS — R740 Nonspecific elevation of levels of transaminase and lactic acid dehydrogenase [LDH]: Secondary | ICD-10-CM

## 2014-03-07 LAB — HEPATIC FUNCTION PANEL
ALT: 125 U/L — AB (ref 0–35)
AST: 98 U/L — AB (ref 0–37)
Albumin: 4 g/dL (ref 3.5–5.2)
Alkaline Phosphatase: 61 U/L (ref 39–117)
BILIRUBIN TOTAL: 0.4 mg/dL (ref 0.2–1.2)
Bilirubin, Direct: 0.1 mg/dL (ref 0.0–0.3)
TOTAL PROTEIN: 7.2 g/dL (ref 6.0–8.3)

## 2014-03-07 LAB — FERRITIN: Ferritin: 177.8 ng/mL (ref 10.0–291.0)

## 2014-03-07 NOTE — Patient Instructions (Signed)
Your physician has requested that you go to the basement for the following lab work before leaving today: LFT's and ferritin.  We will call you with the lab results.   Please call us with worsening symptoms of abdominal pain, bowel changes or swallowing problems.

## 2014-03-08 ENCOUNTER — Encounter: Payer: Self-pay | Admitting: Nurse Practitioner

## 2014-03-08 NOTE — Progress Notes (Signed)
History of Present Illness:  Patient is a 64 year old female known remotely to Dr. Carlean Purl from a colonoscopy in 2009. I saw her in mid January for evaluation of lower abdominal pain followed by an isolated episode of rectal bleeding earlier in the month. By the time I saw patient in the office her pain had resolved (please refer to that dictation). Patient felt fine for nearly a month then around the 13th of this month she developed recurrent lower abdominal pain followed by non-bloody loose stool. Symptoms lasted a couple of days. Patient saw her PCP during this time, CT scan obtained and negative for any acute findings. The last several days patient has began to feel okay again. Her appetite is coming back, no significant abdominal pain.   Patient is scheduled to see Dr. Carlean Purl in March for ongoing evaluation of abnormal LFTs. I started this workup when I saw her in January.      05/22/2011 21:24 05/23/2011 02:55 05/24/2011 04:48 11/05/2013 11:53 01/29/2014 09:29 03/07/2014 12:19  Alkaline Phosphatase 81 71 68 67 72 61  Albumin 4.1 3.4 (L) 3.2 (L) 4.6 4.3 4.0  AST 146 (H) 125 (H) 107 (H) 68 (H) 93 (H) 98 (H)  ALT 152 (H) 130 (H) 112 (H) 77 (H) 88 (H) 125 (H)  Total Protein 8.0 7.0 6.6 7.9 8.0 7.2  Total Bilirubin 0.3 0.2 (L) 0.2 (L) 0.5 0.3 0.4   >Acute hepatitis studies negative in 2013   >Negative AMA and smooth muscle antibody January 2016.   >The CT scan in February of this year revealed cholelithiasis and a left lobe liver cyst (seen on previous studies).   > U/S 2013 suggests steatosis   Patient forgot to mention in her last visit that a few times rice gr have gotten stuck in her throat. This occurs maybe 3 times a year. No problems swallowing meat or any other solids.  Current Medications, Allergies, Past Medical History, Past Surgical History, Family History and Social History were reviewed in [employer] record.  Studies:   Ct Abdomen Pelvis W  Contrast  02/26/2014   CLINICAL DATA:  Diffuse abdominal pain, two episodes with this episode beginning 2 days ago, past history of cholelithiasis, diverticulosis, ectopic pregnancy, hysterectomy  EXAM: CT ABDOMEN AND PELVIS WITH CONTRAST  TECHNIQUE: Multidetector CT imaging of the abdomen and pelvis was performed using the standard protocol following bolus administration of intravenous contrast. Sagittal and coronal MPR images reconstructed from axial data set.  CONTRAST:  23mL OMNIPAQUE IOHEXOL 300 MG/ML SOLN IV. Dilute oral contrast.  COMPARISON:  None  FINDINGS: Calcified pleural plaques at lung bases bilaterally.  Cholelithiasis without gross gallbladder wall thickening or pericholecystic inflammation by CT.  LEFT lobe hepatic cyst 2.8 x 2.6 cm image 20.  Peripelvic LEFT renal cysts.  Mild RIGHT hydronephrosis without ureteral calcification or dilatation.  Liver, spleen, pancreas, kidneys, and adrenal glands otherwise unremarkable.  Normal appendix.  Uterus surgically absent with nonvisualization of ovaries.  Minimal sigmoid diverticulosis without evidence of diverticulitis.  Stomach and bowel loops otherwise unremarkable.  No mass, adenopathy, free fluid, free air, hernia, or inflammatory process.  Question large vertebral hemangioma within L2 vertebra.  IMPRESSION: Hepatic and LEFT renal cysts.  Mild RIGHT hydronephrosis without definite ureteral calcification or dilatation, question due to UPJ obstruction.  Calcified pleural plaques at lung bases which could be due to prior asbestos exposure.  Cholelithiasis.  Sigmoid diverticulosis.   Electronically Signed   By: Crist Infante.D.  On: 02/26/2014 15:29    Physical Exam: General: Pleasant, well developed , white female in no acute distress Head: Normocephalic and atraumatic Eyes:  sclerae anicteric, conjunctiva pink  Ears: Normal auditory acuity Lungs: Clear throughout to auscultation Heart: Regular rate and rhythm Abdomen: Soft, non distended,  non-tender. No masses, no hepatomegaly. Normal bowel sounds Musculoskeletal: Symmetrical with no gross deformities  Extremities: No edema  Neurological: Alert oriented x 4, grossly nonfocal Psychological:  Alert and cooperative. Normal mood and affect  Assessment and Recommendations:  1. Pleasant 64 year old female with 2 episodes of lower abdominal pain followed by loose stool. First episode was early January, second mid February. She felt fine in between episodes. Recent CT scan unremarkable. CBC unremarkable. Etiology is not clear. Patient is feeling back to normal. Etiology of these episodes unclear. We could justify a repeat colonoscopy given the bowel changes and the fact that her last one was done in 2009. A watch and wait approach is also reasonable. Patient prefers the latter. She will call us if there are recurrent episodes are new symptoms arise.   2. Abnormal LFTs, ongoing since 2013. She has a known liver cysts but no other abnormalities on recent CT scan. Ultrasound 2013 did suggest steatosis. Doubt medication related. Acute hepatitis panel, ANA, and ASMA all negative. Will check a ferritin to evaluate for hemochromatosis. Repeat LFTs today, if rising then she may need a liver biopsy.  Patient was scheduled to see Dr. Carlean Purl for further evaluation of LFTs. No need for that appt. I will continue the workup and correspond with Dr. Carlean Purl.   3. Intermittent problems with rice sticking in the esophagus. This occurs about 3 times a year. We discussed evaluation such as barium swallow versus endoscopy. Patient prefers to hold off for now. She will let us know if swallowing problems progress.

## 2014-03-10 NOTE — Progress Notes (Signed)
Ferritin was ok. Chronic transaminitis - probably fatty liver but not clear at this point so a liver biopsy is reasonable.  I lean towards repeating a colonoscopy and evaluation of the dysphagia but patient preferes to observe.  If she consents to liver biopsy would proceed and f/u US in the office.  As stated - any further bowel sxs/bleeding definite colonoscopy and if dysphagia recurs needs Ba swallow or EGD

## 2014-03-12 NOTE — Addendum Note (Signed)
Addended by: Marlon Pel on: 03/12/2014 04:24 PM   Modules accepted: Orders

## 2014-03-12 NOTE — Progress Notes (Signed)
Patient notified of recommendations and agrees to proceed with liver biopsy.  Orders placed.  She is aware they will contact her directly for an appt.

## 2014-03-25 ENCOUNTER — Other Ambulatory Visit: Payer: Self-pay | Admitting: Radiology

## 2014-03-27 ENCOUNTER — Other Ambulatory Visit: Payer: Self-pay | Admitting: Radiology

## 2014-03-27 ENCOUNTER — Ambulatory Visit (HOSPITAL_COMMUNITY): Payer: BLUE CROSS/BLUE SHIELD

## 2014-03-28 ENCOUNTER — Ambulatory Visit (HOSPITAL_COMMUNITY)
Admission: RE | Admit: 2014-03-28 | Discharge: 2014-03-28 | Disposition: A | Discharge status: Home / Self Care | Payer: BLUE CROSS/BLUE SHIELD | Source: Ambulatory Visit | Attending: Nurse Practitioner | Admitting: Nurse Practitioner

## 2014-03-28 ENCOUNTER — Encounter (HOSPITAL_COMMUNITY): Payer: Self-pay

## 2014-03-28 DIAGNOSIS — R7989 Other specified abnormal findings of blood chemistry: Secondary | ICD-10-CM | POA: Diagnosis not present

## 2014-03-28 DIAGNOSIS — R740 Nonspecific elevation of levels of transaminase and lactic acid dehydrogenase [LDH]: Secondary | ICD-10-CM | POA: Diagnosis not present

## 2014-03-28 DIAGNOSIS — K7581 Nonalcoholic steatohepatitis (NASH): Secondary | ICD-10-CM

## 2014-03-28 DIAGNOSIS — R945 Abnormal results of liver function studies: Secondary | ICD-10-CM

## 2014-03-28 HISTORY — DX: Nonalcoholic steatohepatitis (NASH): K75.81

## 2014-03-28 LAB — CBC
HEMATOCRIT: 43.1 % (ref 36.0–46.0)
HEMOGLOBIN: 14.3 g/dL (ref 12.0–15.0)
MCH: 28.9 pg (ref 26.0–34.0)
MCHC: 33.2 g/dL (ref 30.0–36.0)
MCV: 87.1 fL (ref 78.0–100.0)
Platelets: 191 10*3/uL (ref 150–400)
RBC: 4.95 MIL/uL (ref 3.87–5.11)
RDW: 13.1 % (ref 11.5–15.5)
WBC: 7.2 10*3/uL (ref 4.0–10.5)

## 2014-03-28 LAB — PROTIME-INR
INR: 0.99 (ref 0.00–1.49)
PROTHROMBIN TIME: 13.2 s (ref 11.6–15.2)

## 2014-03-28 LAB — APTT: aPTT: 30 seconds (ref 24–37)

## 2014-03-28 MED ORDER — LIDOCAINE HCL (PF) 1 % IJ SOLN
INTRAMUSCULAR | Status: AC
  Filled 2014-03-28: qty 10

## 2014-03-28 MED ORDER — MIDAZOLAM HCL 2 MG/2ML IJ SOLN
INTRAMUSCULAR | Status: AC
  Filled 2014-03-28: qty 2

## 2014-03-28 MED ORDER — FENTANYL CITRATE 0.05 MG/ML IJ SOLN
INTRAMUSCULAR | Status: AC | PRN
  Administered 2014-03-28 (×2): 25 ug via INTRAVENOUS

## 2014-03-28 MED ORDER — MIDAZOLAM HCL 2 MG/2ML IJ SOLN
INTRAMUSCULAR | Status: AC | PRN
  Administered 2014-03-28: 1 mg via INTRAVENOUS

## 2014-03-28 MED ORDER — OXYCODONE HCL 5 MG PO TABS: 5.0000 mg | ORAL_TABLET | Freq: Once | ORAL | Status: DC

## 2014-03-28 MED ORDER — SODIUM CHLORIDE 0.9 % IV SOLN
INTRAVENOUS | Status: DC
  Administered 2014-03-28: 13:00:00 via INTRAVENOUS

## 2014-03-28 MED ORDER — GELATIN ABSORBABLE 12-7 MM EX MISC
CUTANEOUS | Status: AC
  Filled 2014-03-28: qty 1

## 2014-03-28 MED ORDER — FENTANYL CITRATE 0.05 MG/ML IJ SOLN
INTRAMUSCULAR | Status: AC
  Filled 2014-03-28: qty 2

## 2014-03-28 NOTE — Procedures (Signed)
Interventional Radiology Procedure Note  Procedure: US guided biopsy of right liver lobe.  Medical liver biopsy.  3 x 18 G core.  Complications: No immediate Recommendations:  - Ok to shower tomorrow - Do not submerge for 7 days - Routine care  - Follow up results   Signed,  Dulcy Fanny. Earleen Newport, DO

## 2014-03-28 NOTE — Discharge Instructions (Signed)
Liver Biopsy, Care After °These instructions give you information on caring for yourself after your procedure. Your doctor may also give you more specific instructions. Call your doctor if you have any problems or questions after your procedure. °HOME CARE °· Rest at home for 1-2 days or as told by your doctor. °· Have someone stay with you for at least 24 hours. °· Do not do these things in the first 24 hours: °¨ Drive. °¨ Use machinery. °¨ Take care of other people. °¨ Sign legal documents. °¨ Take a bath or shower. °· There are many different ways to close and cover a cut (incision). For example, a cut can be closed with stitches, skin glue, or adhesive strips. Follow your doctor's instructions on: °¨ Taking care of your cut. °¨ Changing and removing your bandage (dressing). °¨ Removing whatever was used to close your cut. °· Do not drink alcohol in the first week. °· Do not lift more than 5 pounds or play contact sports for the first 2 weeks. °· Take medicines only as told by your doctor. For 1 week, do not take medicine that has aspirin in it or medicines like ibuprofen. °· Get your test results. °GET HELP IF: °· A cut bleeds and leaves more than just a small spot of blood. °· A cut is red, puffs up (swells), or hurts more than before. °· Fluid or something else comes from a cut. °· A cut smells bad. °· You have a fever or chills. °GET HELP RIGHT AWAY IF: °· You have swelling, bloating, or pain in your belly (abdomen). °· You get dizzy or faint. °· You have a rash. °· You feel sick to your stomach (nauseous) or throw up (vomit). °· You have trouble breathing, feel short of breath, or feel faint. °· Your chest hurts. °· You have problems talking or seeing. °· You have trouble balancing or moving your arms or legs. °Document Released: 10/07/2007 Document Revised: 05/14/2013 Document Reviewed: 02/23/2013 °ExitCare® Patient Information ©2015 [employer]. This information is not intended to replace advice given to  you by your health care provider. Make sure you discuss any questions you have with your health care provider. ° °

## 2014-03-28 NOTE — H&P (Signed)
Chief Complaint: Elevated liver functions  Referring Physician(s): Willia Craze Dr Carlean Purl  History of Present Illness: Stacy Schultz is a 64 y.o. female   Pt has been aware of elevated liver functions since Apr 16, 2011 Always asymptomatic and followed by PMD Noted continued and worsening levels---referred to Dr Carlean Purl [place] 16-Apr-2011 did reveal steatosis Now scheduled for random liver core biopsy   Past Medical History  Diagnosis Date  . Hamartoma     [place]  . Cholelithiasis   . Diverticulosis     Past Surgical History  Procedure Laterality Date  . Salpingectomy      RIGHT/FOR ECTOPIC PREGNANCY  . Excision of lipoma    . Tonsillectomy    . Oophorectomy    . Vaginal hysterectomy  04-16-2003    [facility], BSO  . Tubal ligation      Allergies: Review of patient's allergies indicates no known allergies.  Medications: Prior to Admission medications   Medication Sig Start Date End Date Taking? Authorizing Provider  Ibuprofen (ADVIL) 200 MG CAPS Take 400 mg by mouth every 6 (six) hours as needed. Pain.   Yes Historical Provider, MD  Vitamin D, Ergocalciferol, (DRISDOL) 50000 UNITS CAPS capsule Take 1 capsule (50,000 Units total) by mouth every 7 (seven) days. 01/28/14  Yes Terrance Mass, MD  pantoprazole (PROTONIX) 40 MG tablet Take 1 tablet (40 mg total) by mouth daily. Patient not taking: Reported on 03/08/2014 02/27/14   Fara Chute, PA-C     Family History  Problem Relation Age of Onset  . Heart disease Daughter     DIED AT 6Y0 WITH ENDOCARDITIS  . Diabetes Mother   . Hypertension Mother   . Kidney disease Mother   . Cancer Father     Mesothelioma  . Diabetes Maternal Aunt   . Ovarian cancer Maternal Aunt   . Heart failure Maternal Grandmother   . Hypertension Maternal Grandmother   . Thyroid disease Maternal Grandmother   . Diabetes Maternal Grandmother   . Diabetes Sister   . Stroke Paternal Grandmother   . Colon cancer Neg Hx   . Colon polyps Neg Hx   .  Gallbladder disease Neg Hx   . Esophageal cancer Neg Hx     History   Social History  . Marital Status: Divorced    Spouse Name: n/a  . Number of Children: 5  . Years of Education: College   Occupational History  . Psychologist, prison and probation services   Social History Main Topics  . Smoking status: Never Smoker   . Smokeless tobacco: Never Used  . Alcohol Use: No  . Drug Use: No  . Sexual Activity: Not Currently    Birth Control/ Protection: Surgical   Other Topics Concern  . None   Social History Narrative   Lives alone. Sons live nearby. Cared for her mother in her home until the mother's death in 04-15-12.       Review of Systems: A 12 point ROS discussed and pertinent positives are indicated in the HPI above.  All other systems are negative.  Review of Systems  Constitutional: Negative for fever, activity change, appetite change and unexpected weight change.  Respiratory: Negative for cough and shortness of breath.   Gastrointestinal: Negative for abdominal pain.  Genitourinary: Negative for difficulty urinating.  Neurological: Negative for weakness.  Psychiatric/Behavioral: Negative for behavioral problems and confusion.    Vital Signs: BP 137/60 mmHg  Pulse 70  Temp(Src)  97.3 F (36.3 C)  Ht 5\' 4"  (1.626 m)  Wt 63.504 kg (140 lb)  BMI 24.02 kg/m2  SpO2 100%  Physical Exam  Constitutional: She is oriented to person, place, and time. She appears well-developed and well-nourished.  Cardiovascular: Normal rate, regular rhythm and normal heart sounds.   Pulmonary/Chest: Effort normal and breath sounds normal. She has no wheezes.  Abdominal: Soft. Bowel sounds are normal. There is no tenderness.  Musculoskeletal: Normal range of motion.  Neurological: She is alert and oriented to person, place, and time.  Skin: Skin is warm and dry.  Psychiatric: She has a normal mood and affect. Her behavior is normal. Judgment and thought content  normal.  Nursing note and vitals reviewed.   Mallampati Score:  MD Evaluation Airway: WNL Heart: WNL Abdomen: WNL Chest/ Lungs: WNL ASA  Classification: 2 Mallampati/Airway Score: One  Imaging: Ct Abdomen Pelvis W Contrast  02/26/2014   CLINICAL DATA:  Diffuse abdominal pain, two episodes with this episode beginning 2 days ago, past history of cholelithiasis, diverticulosis, ectopic pregnancy, hysterectomy  EXAM: CT ABDOMEN AND PELVIS WITH CONTRAST  TECHNIQUE: Multidetector CT imaging of the abdomen and pelvis was performed using the standard protocol following bolus administration of intravenous contrast. Sagittal and coronal MPR images reconstructed from axial data set.  CONTRAST:  44mL OMNIPAQUE IOHEXOL 300 MG/ML SOLN IV. Dilute oral contrast.  COMPARISON:  None  FINDINGS: Calcified pleural plaques at lung bases bilaterally.  Cholelithiasis without gross gallbladder wall thickening or pericholecystic inflammation by CT.  LEFT lobe hepatic cyst 2.8 x 2.6 cm image 20.  Peripelvic LEFT renal cysts.  Mild RIGHT hydronephrosis without ureteral calcification or dilatation.  Liver, spleen, pancreas, kidneys, and adrenal glands otherwise unremarkable.  Normal appendix.  Uterus surgically absent with nonvisualization of ovaries.  Minimal sigmoid diverticulosis without evidence of diverticulitis.  Stomach and bowel loops otherwise unremarkable.  No mass, adenopathy, free fluid, free air, hernia, or inflammatory process.  Question large vertebral hemangioma within L2 vertebra.  IMPRESSION: Hepatic and LEFT renal cysts.  Mild RIGHT hydronephrosis without definite ureteral calcification or dilatation, question due to UPJ obstruction.  Calcified pleural plaques at lung bases which could be due to prior asbestos exposure.  Cholelithiasis.  Sigmoid diverticulosis.   Electronically Signed   By: Lavonia Dana M.D.   On: 02/26/2014 15:29    Labs:  CBC:  Recent Labs  01/25/14 0846 03/28/14 1256  WBC 9.7 7.2    HGB 15.0 14.3  HCT 43.7 43.1  PLT 228 191    COAGS: No results for input(s): INR, APTT in the last 8760 hours.  BMP:  Recent Labs  11/05/13 1153 01/29/14 0929  NA 140 139  K 4.0 4.2  CL 102 103  CO2 25 28  GLUCOSE 98 110*  BUN 9 11  CALCIUM 9.7 10.0  CREATININE 0.94 1.01    LIVER FUNCTION TESTS:  Recent Labs  11/05/13 1153 01/29/14 0929 03/07/14 1219  BILITOT 0.5 0.3 0.4  AST 68* 93* 98*  ALT 77* 88* 125*  ALKPHOS 67 72 61  PROT 7.9 8.0 7.2  ALBUMIN 4.6 4.3 4.0    TUMOR MARKERS: No results for input(s): AFPTM, CEA, CA199, CHROMGRNA in the last 8760 hours.  Assessment and Plan:  Elevated liver function tests Scheduled for random liver bx Risks and Benefits discussed with the patient including, but not limited to bleeding, infection, damage to adjacent structures or low yield requiring additional tests. All of the patient's questions were answered, patient is agreeable  to proceed. Consent signed and in chart.   Thank you for this interesting consult.  I greatly enjoyed meeting Stacy Schultz and look forward to participating in their care.  Signed: Cyanne Delmar A 03/28/2014, 1:30 PM   I spent a total of  20 Minutes   in face to face in clinical consultation, greater than 50% of which was counseling/coordinating care for random liver core bx

## 2014-04-01 ENCOUNTER — Ambulatory Visit: Payer: BLUE CROSS/BLUE SHIELD | Admitting: Internal Medicine

## 2014-04-08 ENCOUNTER — Encounter: Payer: Self-pay | Admitting: Women's Health

## 2014-04-08 ENCOUNTER — Encounter: Payer: Self-pay | Admitting: Nurse Practitioner

## 2014-04-08 ENCOUNTER — Telehealth: Payer: Self-pay | Admitting: Nurse Practitioner

## 2014-04-08 NOTE — Telephone Encounter (Signed)
I had answered patient's email earlier today. I basically told her the same thing Dr. Carlean Purl did. I told her to make appointment with me but she can see Dr. Carlean Purl instead, whichever. Thanks

## 2014-04-08 NOTE — Telephone Encounter (Signed)
Biopsy shows steatohepatitis - fatty liver with inflammation (hepatitis) and also has some scarring in the liver. Not cirrhosis at this point.  She will need to schedule an OV with me to review further please.  Sorry for delay in results.  Do not think stitch like pain is a bad problem - let us know if worse

## 2014-04-08 NOTE — Telephone Encounter (Signed)
Patient given results and recommendations. Scheduled OV on 04/25/14 at 11:30 AM with Dr. Carlean Purl.

## 2014-04-08 NOTE — Telephone Encounter (Signed)
Spoke with patient and she states she started having a "stitch like feeling" right below the biopsy site last Thursday. She took Advil and it helped. She continues to have this uncomfortable feeling. States it is not continuous. She is also asking for biopsy results. Please, advise.

## 2014-04-17 ENCOUNTER — Other Ambulatory Visit: Payer: BLUE CROSS/BLUE SHIELD

## 2014-04-17 DIAGNOSIS — E559 Vitamin D deficiency, unspecified: Secondary | ICD-10-CM

## 2014-04-21 LAB — VITAMIN D 1,25 DIHYDROXY
Vitamin D 1, 25 (OH)2 Total: 32 pg/mL (ref 18–72)
Vitamin D2 1, 25 (OH)2: 23 pg/mL
Vitamin D3 1, 25 (OH)2: 9 pg/mL

## 2014-04-25 ENCOUNTER — Ambulatory Visit (INDEPENDENT_AMBULATORY_CARE_PROVIDER_SITE_OTHER): Payer: BLUE CROSS/BLUE SHIELD | Admitting: Internal Medicine

## 2014-04-25 ENCOUNTER — Other Ambulatory Visit (INDEPENDENT_AMBULATORY_CARE_PROVIDER_SITE_OTHER): Payer: BLUE CROSS/BLUE SHIELD

## 2014-04-25 ENCOUNTER — Encounter: Payer: Self-pay | Admitting: Internal Medicine

## 2014-04-25 VITALS — BP 124/84 | HR 72 | Ht 63.5 in | Wt 143.4 lb

## 2014-04-25 DIAGNOSIS — K7581 Nonalcoholic steatohepatitis (NASH): Secondary | ICD-10-CM

## 2014-04-25 DIAGNOSIS — R7401 Elevation of levels of liver transaminase levels: Secondary | ICD-10-CM

## 2014-04-25 DIAGNOSIS — Z23 Encounter for immunization: Secondary | ICD-10-CM | POA: Diagnosis not present

## 2014-04-25 DIAGNOSIS — K7400 Hepatic fibrosis, unspecified: Secondary | ICD-10-CM

## 2014-04-25 DIAGNOSIS — R740 Nonspecific elevation of levels of transaminase and lactic acid dehydrogenase [LDH]: Secondary | ICD-10-CM

## 2014-04-25 DIAGNOSIS — R748 Abnormal levels of other serum enzymes: Secondary | ICD-10-CM

## 2014-04-25 DIAGNOSIS — R7402 Elevation of levels of lactic acid dehydrogenase (LDH): Secondary | ICD-10-CM

## 2014-04-25 LAB — IGA: IgA: 202 mg/dL (ref 68–378)

## 2014-04-25 NOTE — Progress Notes (Signed)
   Subjective:    Patient ID: Stacy Schultz, female    DOB: 10-31-1950, 64 y.o.   MRN: [id number] Chief complaint: Follow-up after liver biopsy showing Karlene Lineman HPI The patient is here after she had a liver biopsy for persistently abnormal liver function tests which demonstrated nonalcoholic steatohepatitis and stage III bridging fibrosis. She is some occasional gastrointestinal upset, lower abdominal pain and loose stools. This is not as much of a problem as it was a month or 2 ago. Otherwise she's really been out problems. She does not drink alcohol. There is no strong family history of liver disease though she had 1 relative that was overweight and had fatty liver and cirrhosis. Only medication she uses occasional ibuprofen. He has never taken hormone replacement therapy. Medications, allergies, past medical history, past surgical history, family history and social history are reviewed and updated in the EMR.  Review of Systems As above    Objective:   Physical Exam BP 124/84 mmHg  Pulse 72  Ht 5' 3.5" (1.613 m)  Wt 143 lb 6 oz (65.034 kg)  BMI 25.00 kg/m2 She is a well-developed well-nourished normal-appearing middle-aged white woman       Assessment & Plan:   1. Nonalcoholic steatohepatitis (NASH) with bridging fibrosis on liver biopsy   2. Liver fibrosis   3. Abnormal transaminases    I explained the nature of her problem today and what we think the causes are. She does not fit the typical pattern as she is not obese or even overweight. He does not relate a history of significantly elevated triglycerides or cholesterol either. Reviewing her records does show she tends to have lower HDL's and most women. She did have a hemoglobin A1c drawn in 2013 that was 6.2% that I believe would make her prediabetic. Her last glucose on January 19 was 110 though I don't know if that was fasting, probably not. She may have s some insulin resistance.  Today I recommended and she accepted pneumococcal  vaccination given her age and her underlying liver issue.  I am screening her for immunity to have that his DNA and we'll vaccinate accordingly.  I'm checking celiac antibodies with tissue transglutaminase antibody and IgA level as that can be a cause of fatty liver related to this sort of problem.  Further plans pending these results. Check a hemoglobin A1c again most likely make sense. We reviewed how she may be stable and this pattern for a long time and never has serious problems with her liver but she could go on to get cirrhosis and supper complications. I plan to see her routinely at least once a year depending upon her clinical course.  I appreciate the opportunity to care for this patient.   CC: JEFFERY,CHELLE, PA-C

## 2014-04-25 NOTE — Patient Instructions (Signed)
Your physician has requested that you go to the basement for lab work before leaving today.  We have given your a pneumovax today with a vaccination information sheet.   Please follow up with Dr. Carlean Purl in one year.

## 2014-04-26 LAB — HEPATITIS A ANTIBODY, TOTAL: Hep A Total Ab: REACTIVE — AB

## 2014-04-26 LAB — HEPATITIS B SURFACE ANTIBODY,QUALITATIVE: Hep B S Ab: POSITIVE — AB

## 2014-04-26 LAB — HEPATITIS B CORE ANTIBODY, TOTAL: HEP B C TOTAL AB: NONREACTIVE

## 2014-04-26 LAB — TISSUE TRANSGLUTAMINASE, IGA: Tissue Transglutaminase Ab, IgA: 1 U/mL (ref <4)

## 2014-05-01 ENCOUNTER — Other Ambulatory Visit: Payer: Self-pay

## 2014-05-01 ENCOUNTER — Encounter: Payer: Self-pay | Admitting: Internal Medicine

## 2014-05-01 DIAGNOSIS — R7303 Prediabetes: Secondary | ICD-10-CM

## 2014-05-01 HISTORY — DX: Prediabetes: R73.03

## 2014-05-01 NOTE — Progress Notes (Signed)
Quick Note:  She does not have celiac disease She is immune to Hepatitis A and B so does not need vaccinations She needs: 1) Hgb A1 C recheck was prediabetic at 6.2% in 2013 - use prediabetes dx 2) OV w/ me recall 6 months  3) Prevnar vaccine (pneumonia) when appropriate think its 1 year after last pneumovax (4/16) 4) she already avoids EtOH - continue   ______

## 2014-05-01 NOTE — Progress Notes (Signed)
Quick Note:  I was referencing the 13 valent which I think starts 1 year after the 23 Can defer to PCP ______

## 2014-05-09 ENCOUNTER — Other Ambulatory Visit (INDEPENDENT_AMBULATORY_CARE_PROVIDER_SITE_OTHER): Payer: BLUE CROSS/BLUE SHIELD

## 2014-05-09 DIAGNOSIS — R7309 Other abnormal glucose: Secondary | ICD-10-CM | POA: Diagnosis not present

## 2014-05-09 DIAGNOSIS — R7303 Prediabetes: Secondary | ICD-10-CM

## 2014-05-09 LAB — HEMOGLOBIN A1C: Hgb A1c MFr Bld: 6.1 % (ref 4.6–6.5)

## 2014-05-10 NOTE — Progress Notes (Signed)
Quick Note:  Looks like she is pre-diabetic Will ask her to f/u PCP and see me in 6 months re: liver disease - My Chart messgae ______

## 2014-05-27 ENCOUNTER — Ambulatory Visit: Payer: Self-pay

## 2014-05-27 ENCOUNTER — Ambulatory Visit (INDEPENDENT_AMBULATORY_CARE_PROVIDER_SITE_OTHER): Payer: BLUE CROSS/BLUE SHIELD | Admitting: Physician Assistant

## 2014-05-27 VITALS — BP 118/70 | HR 76 | Temp 98.1°F | Resp 18 | Ht 64.25 in | Wt 137.8 lb

## 2014-05-27 DIAGNOSIS — J3489 Other specified disorders of nose and nasal sinuses: Secondary | ICD-10-CM | POA: Diagnosis not present

## 2014-05-27 DIAGNOSIS — R739 Hyperglycemia, unspecified: Secondary | ICD-10-CM | POA: Diagnosis not present

## 2014-05-27 DIAGNOSIS — R05 Cough: Secondary | ICD-10-CM

## 2014-05-27 DIAGNOSIS — R059 Cough, unspecified: Secondary | ICD-10-CM

## 2014-05-27 DIAGNOSIS — R7989 Other specified abnormal findings of blood chemistry: Secondary | ICD-10-CM | POA: Insufficient documentation

## 2014-05-27 MED ORDER — HYDROCOD POLST-CPM POLST ER 10-8 MG/5ML PO SUER: 5.0000 mL | Freq: Two times a day (BID) | ORAL | Status: DC | PRN

## 2014-05-27 MED ORDER — BENZONATATE 100 MG PO CAPS: 100.0000 mg | ORAL_CAPSULE | Freq: Three times a day (TID) | ORAL | Status: DC | PRN

## 2014-05-27 NOTE — Progress Notes (Signed)
Patient ID: Stacy Schultz, female    DOB: [birth year], 64 y.o.   MRN: [id number]  PCP: Wynne Dust  Subjective:   Chief Complaint  Patient presents with  . Cough  . Nasal Congestion    HPI Presents for evaluation of cough.  Cough began 6 days ago. Developed nausea and vomiting the evening of 05/24/2014. I get this stupid thing every 2-3 years. No cold, no fever. Non-productive, "like bronchitis."  Last night she developed a drippy nose and nasal congestion. No nausea or vomiting since 5/13, but reduced appetite and loss of taste. Cough causes head and chest pain. OTC Robitussin isn't helping.  No SOB, chest tightness.  No fever/chills. No dizziness. No sore throat, ear pain.  I last saw her in February with abdominal pain. She's been followed by GI since then. Those notes and labs are reviewed. Of note, she had an elevated A1C.  Review of Systems  Constitutional: Positive for fatigue. Negative for fever and chills.  HENT: Positive for congestion, rhinorrhea and sneezing. Negative for ear pain, postnasal drip, sinus pressure, sore throat and voice change.   Eyes: Negative for discharge, itching and visual disturbance.  Respiratory: Positive for cough. Negative for chest tightness, shortness of breath and wheezing.   Cardiovascular: Positive for chest pain (with cough). Negative for palpitations.  Gastrointestinal: Negative for nausea, vomiting, abdominal pain and diarrhea.  Genitourinary: Negative for dysuria, urgency and frequency.  Musculoskeletal: Negative for myalgias and arthralgias.  Skin: Negative for rash.  Neurological: Positive for headaches (with cough). Negative for dizziness and weakness.       Patient Active Problem List   Diagnosis Date Noted  . Prediabetes 05/01/2014  . Nonalcoholic steatohepatitis (NASH) with bridging fibrosis on liver biopsy 04/25/2014  . Elevated LFTs   . Gallstones 11/05/2013  . Hepatic cyst 11/05/2013  . Steatosis of liver  11/05/2013  . Lipoma of neck 11/05/2013  . Transaminitis 05/23/2011  . Hydronephrosis of left kidney 05/23/2011  . HYPERLIPIDEMIA, MIXED 10/13/2006  . PALPITATIONS 10/13/2006  . ABNORMAL ELECTROCARDIOGRAM 10/13/2006     Prior to Admission medications   Medication Sig Start Date End Date Taking? Authorizing Provider  Ibuprofen (ADVIL) 200 MG CAPS Take 400 mg by mouth every 6 (six) hours as needed. Pain.    Historical Provider, MD     No Known Allergies     Objective:  Physical Exam  Constitutional: She is oriented to person, place, and time. Vital signs are normal. She appears well-developed and well-nourished. No distress.  BP 118/70 mmHg  Pulse 76  Temp(Src) 98.1 F (36.7 C) (Oral)  Resp 18  Ht 5' 4.25" (1.632 m)  Wt 137 lb 12.8 oz (62.506 kg)  BMI 23.47 kg/m2  SpO2 98%   HENT:  Head: Normocephalic and atraumatic.  Right Ear: Hearing, tympanic membrane, external ear and ear canal normal.  Left Ear: Hearing, tympanic membrane, external ear and ear canal normal.  Nose: Rhinorrhea present. No mucosal edema.  No foreign bodies. Right sinus exhibits no maxillary sinus tenderness and no frontal sinus tenderness. Left sinus exhibits no maxillary sinus tenderness and no frontal sinus tenderness.  Mouth/Throat: Uvula is midline, oropharynx is clear and moist and mucous membranes are normal. No uvula swelling. No oropharyngeal exudate.  Eyes: Conjunctivae and EOM are normal. Pupils are equal, round, and reactive to light. Right eye exhibits no discharge. Left eye exhibits no discharge. No scleral icterus.  Neck: Trachea normal, normal range of motion and full passive range of motion without  pain. Neck supple. No thyroid mass and no thyromegaly present.  Cardiovascular: Normal rate, regular rhythm and normal heart sounds.   Pulmonary/Chest: Effort normal and breath sounds normal. She has no decreased breath sounds. She has no wheezes. She has no rhonchi. She has no rales.    Lymphadenopathy:       Head (right side): No submandibular, no tonsillar, no preauricular, no posterior auricular and no occipital adenopathy present.       Head (left side): No submandibular, no tonsillar, no preauricular and no occipital adenopathy present.    She has no cervical adenopathy.       Right: No supraclavicular adenopathy present.       Left: No supraclavicular adenopathy present.  Neurological: She is alert and oriented to person, place, and time. She has normal strength. No cranial nerve deficit or sensory deficit.  Skin: Skin is warm, dry and intact. No rash noted.  Psychiatric: She has a normal mood and affect. Her speech is normal and behavior is normal.           Assessment & Plan:   1. Cough 2. Stuffy and runny nose Likely viral. Supportive care. OTC oral antihistamine.  - benzonatate (TESSALON) 100 MG capsule; Take 1-2 capsules (100-200 mg total) by mouth 3 (three) times daily as needed for cough.  Dispense: 40 capsule; Refill: 0 - chlorpheniramine-HYDROcodone (TUSSIONEX PENNKINETIC ER) 10-8 MG/5ML SUER; Take 5 mLs by mouth every 12 (twelve) hours as needed for cough.  Dispense: 100 mL; Refill: 0   3. Hyperglycemia A1C was 6.1% 2 weeks ago, 6.2% in 2013 and 5.9% 7 years ago. Briefly discussed healthy eating choices and exercise to prevent progression and referred to Diabetes Education. - Ambulatory referral to diabetic education   Fara Chute, PA-C Physician Assistant-Certified [facility] .

## 2014-05-27 NOTE — Patient Instructions (Addendum)
Take a daily vitamin D supplement, 2000 IU each day.  The Tussionex contains hydrocodone, and so may cause drowsiness, constipation and nausea. Use it at bedtime, or during the day if you can rest and the [employer] are inadequate.  An OTC oral antihistamine (like Claritin, Allegra or Zyrtec) will help with the stuffy and runny nose).

## 2014-06-05 ENCOUNTER — Encounter: Payer: Self-pay | Admitting: Physician Assistant

## 2014-06-07 NOTE — Telephone Encounter (Signed)
Please respond to patients request via e-mail.  [phone]

## 2014-06-11 MED ORDER — AMOXICILLIN-POT CLAVULANATE 875-125 MG PO TABS: 1.0000 | ORAL_TABLET | Freq: Two times a day (BID) | ORAL | Status: AC

## 2014-06-11 NOTE — Telephone Encounter (Signed)
Patient notified via My Chart.  Meds ordered this encounter  Medications  . amoxicillin-clavulanate (AUGMENTIN) 875-125 MG per tablet    Sig: Take 1 tablet by mouth 2 (two) times daily.    Dispense:  20 tablet    Refill:  0    Order Specific Question:  Supervising Provider    Answer:  DOOLITTLE, ROBERT P [[id number]]

## 2014-09-20 ENCOUNTER — Encounter: Payer: Self-pay | Admitting: Internal Medicine

## 2014-12-03 ENCOUNTER — Ambulatory Visit (INDEPENDENT_AMBULATORY_CARE_PROVIDER_SITE_OTHER): Payer: BLUE CROSS/BLUE SHIELD | Admitting: Internal Medicine

## 2014-12-03 ENCOUNTER — Encounter: Payer: Self-pay | Admitting: Internal Medicine

## 2014-12-03 ENCOUNTER — Other Ambulatory Visit (INDEPENDENT_AMBULATORY_CARE_PROVIDER_SITE_OTHER): Payer: BLUE CROSS/BLUE SHIELD

## 2014-12-03 VITALS — BP 114/68 | HR 76 | Ht 63.5 in | Wt 132.4 lb

## 2014-12-03 DIAGNOSIS — K7581 Nonalcoholic steatohepatitis (NASH): Secondary | ICD-10-CM

## 2014-12-03 LAB — HEPATIC FUNCTION PANEL
ALT: 56 U/L — AB (ref 0–35)
AST: 58 U/L — ABNORMAL HIGH (ref 0–37)
Albumin: 4.5 g/dL (ref 3.5–5.2)
Alkaline Phosphatase: 62 U/L (ref 39–117)
BILIRUBIN DIRECT: 0.1 mg/dL (ref 0.0–0.3)
Total Bilirubin: 0.7 mg/dL (ref 0.2–1.2)
Total Protein: 8.2 g/dL (ref 6.0–8.3)

## 2014-12-03 NOTE — Patient Instructions (Signed)
   Your physician has requested that you go to the basement for the following lab work before leaving today: LFT's    I appreciate the opportunity to care for you. Silvano Rusk, MD, [facility]

## 2014-12-03 NOTE — Assessment & Plan Note (Addendum)
Recheck LFT's and look into studies at tertiary centers for NASH. RTC 6-12 mos and recheck labs 6 mos

## 2014-12-03 NOTE — Progress Notes (Signed)
   Subjective:    Patient ID: Stacy Schultz, female    DOB: 05/14/1950, 64 y.o.   MRN: [id number] Cc: fatty liver HPI She is here for f/u after liver bx has shown NASH and slight fibrosis. No c/o today. Medications, allergies, past medical history, past surgical history, family history and social history are reviewed and updated in the EMR.  Review of Systems As above    Objective:   Physical Exam BP 114/68 mmHg  Pulse 76  Ht 5' 3.5" (1.613 m)  Wt 132 lb 6 oz (60.045 kg)  BMI 23.08 kg/m2 NAD    Assessment & Plan:  Nonalcoholic steatohepatitis (NASH) with bridging fibrosis on liver biopsy Recheck LFT's and look into studies at tertiary centers for NASH. RTC 6-12 mos and recheck labs 6 mos

## 2014-12-04 ENCOUNTER — Encounter: Payer: Self-pay | Admitting: Internal Medicine

## 2014-12-10 ENCOUNTER — Other Ambulatory Visit: Payer: Self-pay

## 2014-12-10 DIAGNOSIS — R7989 Other specified abnormal findings of blood chemistry: Secondary | ICD-10-CM

## 2014-12-10 DIAGNOSIS — R945 Abnormal results of liver function studies: Principal | ICD-10-CM

## 2014-12-10 NOTE — Progress Notes (Signed)
Quick Note:  LFT's better Will repeat in 6 months Will notify by My Chart to come for labs early June 2017 Will see if she would want to go to [facility] to see about Tx trials  Please enter orders and reminder ______

## 2014-12-25 ENCOUNTER — Encounter: Payer: Self-pay | Admitting: Women's Health

## 2015-01-28 ENCOUNTER — Ambulatory Visit (INDEPENDENT_AMBULATORY_CARE_PROVIDER_SITE_OTHER): Payer: BLUE CROSS/BLUE SHIELD | Admitting: Women's Health

## 2015-01-28 ENCOUNTER — Other Ambulatory Visit: Payer: Self-pay | Admitting: Women's Health

## 2015-01-28 ENCOUNTER — Encounter: Payer: Self-pay | Admitting: Women's Health

## 2015-01-28 VITALS — BP 136/78 | Ht 63.0 in | Wt 132.0 lb

## 2015-01-28 DIAGNOSIS — Z13220 Encounter for screening for lipoid disorders: Secondary | ICD-10-CM | POA: Diagnosis not present

## 2015-01-28 DIAGNOSIS — Z01419 Encounter for gynecological examination (general) (routine) without abnormal findings: Secondary | ICD-10-CM | POA: Diagnosis not present

## 2015-01-28 DIAGNOSIS — E559 Vitamin D deficiency, unspecified: Secondary | ICD-10-CM

## 2015-01-28 LAB — CBC WITH DIFFERENTIAL/PLATELET
Basophils Absolute: 0 10*3/uL (ref 0.0–0.1)
Basophils Relative: 0 % (ref 0–1)
Eosinophils Absolute: 0.1 10*3/uL (ref 0.0–0.7)
Eosinophils Relative: 1 % (ref 0–5)
HEMATOCRIT: 42.7 % (ref 36.0–46.0)
HEMOGLOBIN: 14.5 g/dL (ref 12.0–15.0)
LYMPHS ABS: 3 10*3/uL (ref 0.7–4.0)
LYMPHS PCT: 34 % (ref 12–46)
MCH: 29.2 pg (ref 26.0–34.0)
MCHC: 34 g/dL (ref 30.0–36.0)
MCV: 86.1 fL (ref 78.0–100.0)
MONO ABS: 0.5 10*3/uL (ref 0.1–1.0)
MONOS PCT: 6 % (ref 3–12)
MPV: 11.1 fL (ref 8.6–12.4)
NEUTROS ABS: 5.2 10*3/uL (ref 1.7–7.7)
Neutrophils Relative %: 59 % (ref 43–77)
Platelets: 211 10*3/uL (ref 150–400)
RBC: 4.96 MIL/uL (ref 3.87–5.11)
RDW: 13.7 % (ref 11.5–15.5)
WBC: 8.8 10*3/uL (ref 4.0–10.5)

## 2015-01-28 LAB — COMPREHENSIVE METABOLIC PANEL
ALK PHOS: 57 U/L (ref 33–130)
ALT: 20 U/L (ref 6–29)
AST: 22 U/L (ref 10–35)
Albumin: 4.2 g/dL (ref 3.6–5.1)
BUN: 10 mg/dL (ref 7–25)
CHLORIDE: 100 mmol/L (ref 98–110)
CO2: 28 mmol/L (ref 20–31)
Calcium: 9 mg/dL (ref 8.6–10.4)
Creat: 0.9 mg/dL (ref 0.50–0.99)
GLUCOSE: 124 mg/dL — AB (ref 65–99)
POTASSIUM: 3.5 mmol/L (ref 3.5–5.3)
SODIUM: 140 mmol/L (ref 135–146)
Total Bilirubin: 0.6 mg/dL (ref 0.2–1.2)
Total Protein: 6.9 g/dL (ref 6.1–8.1)

## 2015-01-28 LAB — LIPID PANEL
Cholesterol: 158 mg/dL (ref 125–200)
HDL: 46 mg/dL (ref >=46)
LDL CALC: 86 mg/dL (ref <130)
TRIGLYCERIDES: 131 mg/dL (ref <150)
Total CHOL/HDL Ratio: 3.4 Ratio (ref <=5.0)
VLDL: 26 mg/dL (ref <30)

## 2015-01-28 NOTE — Progress Notes (Signed)
Stacy Schultz Feb 11, 1950 [id number]    History:    Presents for annual exam.  2005 LAVH with BSO for DUB on no HRT. Normal Pap and mammogram history. 2009 negative colonoscopy. 2016 T score -1.5 FRAX 9.4%/1.2%. Being evaluated  for fatty liver, 03/2014 liver biopsy,no risk factors contributing. Not sexually active. Pneumovax and T dap current.  Past medical history, past surgical history, family history and social history were all reviewed and documented in the EPIC chart. Works at the [employer]. Has 4 children 1 daughter died age 59 endocarditis. 7 grandchildren.  ROS:  A ROS was performed and pertinent positives and negatives are included.  Exam:  Filed Vitals:   01/28/15 1544  BP: 136/78    General appearance:  Normal Thyroid:  Symmetrical, normal in size, without palpable masses or nodularity. Respiratory  Auscultation:  Clear without wheezing or rhonchi Cardiovascular  Auscultation:  Regular rate, without rubs, murmurs or gallops  Edema/varicosities:  Not grossly evident Abdominal  Soft,nontender, without masses, guarding or rebound.  Liver/spleen:  No organomegaly noted  Hernia:  None appreciated  Skin  Inspection:  Grossly normal   Breasts: Examined lying and sitting.     Right: Without masses, retractions, discharge or axillary adenopathy.     Left: Without masses, retractions, discharge or axillary adenopathy. Gentitourinary   Inguinal/mons:  Normal without inguinal adenopathy  External genitalia:  Normal  BUS/Urethra/Skene's glands:  Normal  Vagina:  Normal  Cervix:  And uterus absent  Adnexa/parametria:     Rt: Without masses or tenderness.   Lt: Without masses or tenderness.  Anus and perineum: Normal  Digital rectal exam: Normal sphincter tone without palpated masses or tenderness  Assessment/Plan:  65 y.o. D WF G6 P4 for annual exam with no complaints.  2005 LAVH with BSO on no HRT 2016 Normal DEXA Fatty liver Dr. Carlean Purl  Plan:  Zostavax recommended. SBE's, continue annual screening mammogram, calcium rich diet, vitamin D 2000 daily. CBC, lipid panel, CMP, TSH, vitamin D, UA. Normal Pap history, new screening guidelines reviewed. Home safety, fall prevention and importance of weightbearing exercise reviewed.  [place], 5:02 PM 01/28/2015

## 2015-01-28 NOTE — Patient Instructions (Signed)

## 2015-01-29 LAB — URINALYSIS W MICROSCOPIC + REFLEX CULTURE
BACTERIA UA: NONE SEEN [HPF]
BILIRUBIN URINE: NEGATIVE
CASTS: NONE SEEN [LPF]
CRYSTALS: NONE SEEN [HPF]
Glucose, UA: NEGATIVE
Hgb urine dipstick: NEGATIVE
KETONES UR: NEGATIVE
Leukocytes, UA: NEGATIVE
Nitrite: NEGATIVE
PROTEIN: NEGATIVE
RBC / HPF: NONE SEEN RBC/HPF (ref <=2)
Specific Gravity, Urine: 1.019 (ref 1.001–1.035)
Squamous Epithelial / LPF: NONE SEEN [HPF] (ref <=5)
Yeast: NONE SEEN [HPF]
pH: 5.5 (ref 5.0–8.0)

## 2015-01-29 LAB — VITAMIN D 25 HYDROXY (VIT D DEFICIENCY, FRACTURES): Vit D, 25-Hydroxy: 31 ng/mL (ref 30–100)

## 2015-01-30 LAB — URINE CULTURE

## 2015-01-30 LAB — HEMOGLOBIN A1C
HEMOGLOBIN A1C: 5.9 % — AB (ref <5.7)
MEAN PLASMA GLUCOSE: 123 mg/dL — AB (ref <117)

## 2015-02-02 ENCOUNTER — Encounter: Payer: Self-pay | Admitting: Women's Health

## 2015-04-15 ENCOUNTER — Encounter (INDEPENDENT_AMBULATORY_CARE_PROVIDER_SITE_OTHER): Payer: BLUE CROSS/BLUE SHIELD

## 2015-04-15 ENCOUNTER — Ambulatory Visit (INDEPENDENT_AMBULATORY_CARE_PROVIDER_SITE_OTHER): Payer: BLUE CROSS/BLUE SHIELD | Admitting: Internal Medicine

## 2015-04-15 ENCOUNTER — Encounter: Payer: Self-pay | Admitting: Internal Medicine

## 2015-04-15 VITALS — BP 112/70 | HR 72 | Ht 64.0 in | Wt 131.4 lb

## 2015-04-15 DIAGNOSIS — R002 Palpitations: Secondary | ICD-10-CM | POA: Diagnosis not present

## 2015-04-15 NOTE — Patient Instructions (Signed)
Your physician has recommended that you wear an event monitor for ONE WEEK. Event monitors are medical devices that record the heart's electrical activity. Doctors most often us these monitors to diagnose arrhythmias. Arrhythmias are problems with the speed or rhythm of the heartbeat. The monitor is a small, portable device. You can wear one while you do your normal daily activities. This is usually used to diagnose what is causing palpitations/syncope (passing out).  Your physician recommends that you schedule a follow-up appointment after your monitor  

## 2015-04-15 NOTE — Progress Notes (Signed)
OFFICE NOTE  Chief Complaint:  Palpitations  Primary Care Physician: JEFFERY,CHELLE, PA-C  HPI:  Stacy Schultz is a 65 y.o. female with some intermittent palpitations in the past, however remotely. She was previously seen by Dr. Aundra Dubin as well as Dr. Percival Spanish, last in 2013 when she was hospitalized for cough and upper respiratory symptoms. In the past she had been diagnosed with mitral valve prolapse but wasn't told that by new diagnostic criteria for mitral valve did not prolapse. She is also has stress testing the past which was fairly normal. There is no significant heart disease in her parents other than her grandmother who died of a heart attack at an older age. Unfortunately she had one daughter who died suddenly of ventricular fibrillation after getting the flu. Recently Mrs. Broschart has had some episodes of palpitations. Specifically she was watching her grandkids and had an episode where she had about 24 hours of significant palpitations it did not go away. They're associated with some nausea and shortness of breath. Afterwards she had some fatigue. Since then she's had no other significant episodes but still has some intermittent palpitations. She denies any worsening chest pain with exertion.  PMHx:  Past Medical History  Diagnosis Date  . 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 Cholelithiasis   . Diverticulosis   . Steatohepatitis 03/28/2014    Stage III bridging fibrosis on liver biopsy  . Prediabetes 05/01/2014    Lab Results Component Value Date  HGBA1C 6.2* 05/23/2011    . Low serum vitamin D     improved with prescription treatment to 32    Past Surgical History  Procedure Laterality Date  . Salpingectomy      RIGHT/FOR ECTOPIC PREGNANCY  . Excision of lipoma    . Tonsillectomy    . Oophorectomy    . Vaginal hysterectomy  03/2003    LAVH, BSO  . Tubal ligation    . Liver biopsy  2016    FAMHx:  Family History  Problem Relation Age of Onset  . Heart disease Daughter     DIED AT 5Y0 WITH ENDOCARDITIS  . Diabetes Mother   . Hypertension Mother   . Kidney disease Mother   . Cancer Father     Mesothelioma  . Diabetes Maternal Aunt   . Ovarian cancer Maternal Aunt   . Heart failure Maternal Grandmother   . Hypertension Maternal Grandmother   . Thyroid disease Maternal Grandmother   . Diabetes Maternal Grandmother   . Diabetes Sister   . Stroke Paternal Grandmother   . Colon cancer Neg Hx   . Colon polyps Neg Hx   . Gallbladder disease Neg Hx   . Esophageal cancer Neg Hx     SOCHx:   reports that she has never smoked. She has never used smokeless tobacco. She reports that she does not drink alcohol or use illicit drugs.  ALLERGIES:  No Known Allergies  ROS: Pertinent items noted in HPI and remainder of comprehensive ROS otherwise negative.  HOME MEDS: Current Outpatient Prescriptions  Medication Sig Dispense Refill  . cholecalciferol (VITAMIN D) 1000 UNITS tablet Take 1,000 Units by mouth daily.    . clobetasol (TEMOVATE) 0.05 % external solution APPLY TO AFFECTED AREA UP TO 2 TIMES DAILY AS NEEDED **NOT FACE,GROIN,AXILLA**  3  . Ibuprofen (ADVIL) 200 MG CAPS Take 400 mg by mouth every 6 (six) hours as needed. Pain.     No current facility-administered medications for this visit.  LABS/IMAGING: No results found for this or any previous visit (from the past 48 hour(s)). No results found.  WEIGHTS: Wt Readings from Last 3 Encounters:  04/15/15 131 lb 6 oz (59.591 kg)  01/28/15 132 lb (59.875 kg)  12/03/14 132 lb 6 oz (60.045 kg)    VITALS: BP 112/70 mmHg  Pulse 72  Ht 5\' 4"  (1.626 m)  Wt 131 lb 6 oz (59.591 kg)  BMI 22.54 kg/m2  EXAM: General appearance: alert and no distress Neck: no carotid bruit, no JVD and thyroid not enlarged, symmetric, no tenderness/mass/nodules Lungs: clear to auscultation bilaterally Heart: regular rate and rhythm, S1, S2 normal, no murmur, click, rub or gallop Abdomen: soft, non-tender; bowel  sounds normal; no masses,  no organomegaly Extremities: extremities normal, atraumatic, no cyanosis or edema Pulses: 2+ and symmetric Skin: Skin color, texture, turgor normal. No rashes or lesions Neurologic: Grossly normal Psych: Pleasant  EKG: Normal sinus rhythm at 72, rightward axis, nonspecific ST and T changes, likely repolarization abnormality  ASSESSMENT: 1. Palpitations, possibly concerning for atrial fibrillation  PLAN: 1.   Mrs. Commons has few if any known medical problems. She does see her doctors fairly regularly. She was told she had an abnormal heart rhythm by her OB/GYN. Recently she's had palpitations and one episode of more severe palpitations which sounds like possibly atrial fibrillation. I like to place her on a one-week monitor to see if we can pick up on any of these events. Plan to see her back afterwards.  Pixie Casino, MD, [facility] Attending Cardiologist Springer C Njeri Vicente 04/15/2015, 5:33 PM

## 2015-04-17 ENCOUNTER — Encounter: Payer: Self-pay | Admitting: *Deleted

## 2015-04-21 DIAGNOSIS — R002 Palpitations: Secondary | ICD-10-CM | POA: Diagnosis not present

## 2015-04-23 ENCOUNTER — Ambulatory Visit (INDEPENDENT_AMBULATORY_CARE_PROVIDER_SITE_OTHER): Payer: BLUE CROSS/BLUE SHIELD | Admitting: Cardiology

## 2015-04-23 ENCOUNTER — Encounter: Payer: Self-pay | Admitting: Cardiology

## 2015-04-23 VITALS — BP 120/74 | HR 62 | Ht 64.0 in | Wt 132.8 lb

## 2015-04-23 DIAGNOSIS — R002 Palpitations: Secondary | ICD-10-CM

## 2015-04-23 DIAGNOSIS — I4940 Unspecified premature depolarization: Secondary | ICD-10-CM

## 2015-04-23 DIAGNOSIS — I493 Ventricular premature depolarization: Secondary | ICD-10-CM

## 2015-04-23 MED ORDER — METOPROLOL TARTRATE 25 MG PO TABS: 12.5000 mg | ORAL_TABLET | Freq: Two times a day (BID) | ORAL | Status: DC | PRN

## 2015-04-23 NOTE — Assessment & Plan Note (Signed)
Pt with a long history of palpitations- One week Holter shows frequent PVCs.

## 2015-04-23 NOTE — Patient Instructions (Signed)
Your physician wants you to follow-up in: 6 Months with Dr Debara Pickett. You will receive a reminder letter in the mail two months in advance. If you don't receive a letter, please call our office to schedule the follow-up appointment.  Your physician has recommended you make the following change in your medication: START Metoprolol 12.5 mg twice a day as needed  Your physician has requested that you have an echocardiogram. Echocardiography is a painless test that uses sound waves to create images of your heart. It provides your doctor with information about the size and shape of your heart and how well your heart's chambers and valves are working. This procedure takes approximately one hour. There are no restrictions for this procedure.  Please avoid Caffeine beverages

## 2015-04-23 NOTE — Progress Notes (Signed)
    04/23/2015 Stacy Schultz   Mar 26, 1950  [id number]  Primary Physician JEFFERY,CHELLE, PA-C Primary Cardiologist: Dr Debara Pickett  HPI:  Pleasant 65 y/o female with a history of palpations. She has had previously low risk Myoview. Echo in 04-24-07 showed normal LVF. She saw Dr Debara Pickett last week with complaints of recurrent palpitations. A 7 day Holter monitor showed frequent PVCs. She admitted to me she drinks coffee, sweat tea, and Dr Malachi Bonds. She denies any sustained tachycardia. She denies syncope or near syncope.    Current Outpatient Prescriptions  Medication Sig Dispense Refill  . cholecalciferol (VITAMIN D) 1000 UNITS tablet Take 1,000 Units by mouth daily.    . clobetasol (TEMOVATE) 0.05 % external solution APPLY TO AFFECTED AREA UP TO 2 TIMES DAILY AS NEEDED **NOT FACE,GROIN,AXILLA**  3  . Ibuprofen (ADVIL) 200 MG CAPS Take 400 mg by mouth every 6 (six) hours as needed. Pain.    . metoprolol tartrate (LOPRESSOR) 25 MG tablet Take 0.5 tablets (12.5 mg total) by mouth 2 (two) times daily as needed. 30 tablet 3   No current facility-administered medications for this visit.    No Known Allergies  Social History   Social History  . Marital Status: Divorced    Spouse Name: n/a  . Number of Children: 5  . Years of Education: College   Occupational History  . Psychologist, prison and probation services   Social History Main Topics  . Smoking status: Never Smoker   . Smokeless tobacco: Never Used  . Alcohol Use: No  . Drug Use: No  . Sexual Activity: Not Currently    Birth Control/ Protection: Surgical   Other Topics Concern  . Not on file   Social History Narrative   Lives alone. Sons live nearby. Cared for her mother in her home until the mother's death in April 23, 2012.        epworth sleepiness scale = 7 (04/15/15)     Review of Systems: General: negative for chills, fever, night sweats or weight changes.  Cardiovascular: negative for chest pain, dyspnea on  exertion, edema, orthopnea, palpitations, paroxysmal nocturnal dyspnea or shortness of breath Dermatological: negative for rash Respiratory: negative for cough or wheezing Urologic: negative for hematuria Abdominal: negative for nausea, vomiting, diarrhea, bright red blood per rectum, melena, or hematemesis Neurologic: negative for visual changes, syncope, or dizziness All other systems reviewed and are otherwise negative except as noted above.    Blood pressure 120/74, pulse 62, height 5\' 4"  (1.626 m), weight 132 lb 12.8 oz (60.238 kg), SpO2 99 %.  General appearance: alert, cooperative and no distress Lungs: clear to auscultation bilaterally Heart: regular rate and rhythm Extremities: extremities normal, atraumatic, no cyanosis or edema Neurologic: Grossly normal    ASSESSMENT AND PLAN:   PALPITATIONS Pt with a long history of palpitations- One week Holter shows frequent PVCs.   Frequent PVCs .   PLAN  Document LVF with echo. Metoprolol 12.5 mg BID PRN palpitations. Cut way back on her caffeine use.   Kerin Ransom K PA-C 04/23/2015 4:18 PM

## 2015-05-01 DIAGNOSIS — L42 Pityriasis rosea: Secondary | ICD-10-CM | POA: Diagnosis not present

## 2015-05-13 ENCOUNTER — Other Ambulatory Visit: Payer: Self-pay

## 2015-05-13 ENCOUNTER — Ambulatory Visit (HOSPITAL_COMMUNITY): Payer: BLUE CROSS/BLUE SHIELD | Attending: Internal Medicine

## 2015-05-13 DIAGNOSIS — R002 Palpitations: Secondary | ICD-10-CM | POA: Diagnosis not present

## 2015-05-13 DIAGNOSIS — I071 Rheumatic tricuspid insufficiency: Secondary | ICD-10-CM | POA: Diagnosis not present

## 2015-05-13 DIAGNOSIS — I4940 Unspecified premature depolarization: Secondary | ICD-10-CM | POA: Diagnosis not present

## 2015-05-13 DIAGNOSIS — I371 Nonrheumatic pulmonary valve insufficiency: Secondary | ICD-10-CM | POA: Insufficient documentation

## 2015-05-13 DIAGNOSIS — I351 Nonrheumatic aortic (valve) insufficiency: Secondary | ICD-10-CM | POA: Insufficient documentation

## 2015-05-13 DIAGNOSIS — I358 Other nonrheumatic aortic valve disorders: Secondary | ICD-10-CM | POA: Diagnosis not present

## 2015-06-16 ENCOUNTER — Other Ambulatory Visit (INDEPENDENT_AMBULATORY_CARE_PROVIDER_SITE_OTHER): Payer: BLUE CROSS/BLUE SHIELD

## 2015-06-16 DIAGNOSIS — R7989 Other specified abnormal findings of blood chemistry: Secondary | ICD-10-CM | POA: Diagnosis not present

## 2015-06-16 DIAGNOSIS — R945 Abnormal results of liver function studies: Principal | ICD-10-CM

## 2015-06-16 LAB — HEPATIC FUNCTION PANEL
ALT: 19 U/L (ref 0–35)
AST: 22 U/L (ref 0–37)
Albumin: 4.3 g/dL (ref 3.5–5.2)
Alkaline Phosphatase: 55 U/L (ref 39–117)
BILIRUBIN TOTAL: 0.5 mg/dL (ref 0.2–1.2)
Bilirubin, Direct: 0.1 mg/dL (ref 0.0–0.3)
TOTAL PROTEIN: 7.6 g/dL (ref 6.0–8.3)

## 2015-06-16 NOTE — Progress Notes (Signed)
Quick Note:  Liver tests are normal again My Chart note ______

## 2015-06-17 ENCOUNTER — Encounter: Payer: Self-pay | Admitting: Internal Medicine

## 2015-09-02 ENCOUNTER — Ambulatory Visit (INDEPENDENT_AMBULATORY_CARE_PROVIDER_SITE_OTHER): Payer: BLUE CROSS/BLUE SHIELD | Admitting: Physician Assistant

## 2015-09-02 ENCOUNTER — Encounter: Payer: Self-pay | Admitting: Physician Assistant

## 2015-09-02 VITALS — BP 116/72 | HR 89 | Temp 98.7°F | Resp 18 | Ht 64.0 in | Wt 134.0 lb

## 2015-09-02 DIAGNOSIS — H66001 Acute suppurative otitis media without spontaneous rupture of ear drum, right ear: Secondary | ICD-10-CM

## 2015-09-02 MED ORDER — AMOXICILLIN-POT CLAVULANATE 875-125 MG PO TABS: 1.0000 | ORAL_TABLET | Freq: Two times a day (BID) | ORAL | 0 refills | Status: AC

## 2015-09-02 NOTE — Patient Instructions (Signed)
     IF you received an x-ray today, you will receive an invoice from [facility]. Please contact [facility] at [phone] with questions or concerns regarding your invoice.   IF you received labwork today, you will receive an invoice from [facility]. Please contact [facility] at [phone] with questions or concerns regarding your invoice.   Our billing staff will not be able to assist you with questions regarding bills from these companies.  You will be contacted with the lab results as soon as they are available. The fastest way to get your results is to activate your My Chart account. Instructions are located on the last page of this paperwork. If you have not heard from us regarding the results in 2 weeks, please contact this office.      

## 2015-09-02 NOTE — Progress Notes (Signed)
Patient ID: Stacy Schultz, female    DOB: September 14, 1950, 65 y.o.   MRN: [id number]  PCP: Harrison Mons, PA-C  Subjective:   Chief Complaint  Patient presents with  . Cough  . Sinusitis    HPI Presents for evaluation of cough and possible sinusitis.  Symptoms began a week ago with laryngitis. Then developed a cough. Reduced hearing, pressure and pain in the RIGHT ear. Feels like cotton in my head. Intermittent sore throat. Sputum and nasal drainage is dark colored. DayQuil helped a little.    Review of Systems As above.   Patient Active Problem List   Diagnosis Date Noted  . Frequent PVCs 04/23/2015  . Low serum vitamin D   . Prediabetes 05/01/2014  . Nonalcoholic steatohepatitis (NASH) with bridging fibrosis on liver biopsy 04/25/2014  . Elevated LFTs   . Gallstones 11/05/2013  . Hepatic cyst 11/05/2013  . Steatosis of liver 11/05/2013  . Lipoma of neck 11/05/2013  . Transaminitis 05/23/2011  . Hydronephrosis of left kidney 05/23/2011  . HYPERLIPIDEMIA, MIXED 10/13/2006  . PALPITATIONS 10/13/2006  . ABNORMAL ELECTROCARDIOGRAM 10/13/2006     Prior to Admission medications   Medication Sig Start Date End Date Taking? Authorizing Provider  cholecalciferol (VITAMIN D) 1000 UNITS tablet Take 1,000 Units by mouth daily.   Yes Historical Provider, MD  clobetasol (TEMOVATE) 0.05 % external solution APPLY TO AFFECTED AREA UP TO 2 TIMES DAILY AS NEEDED **NOT FACE,GROIN,AXILLA** 02/08/15  Yes Historical Provider, MD  Ibuprofen (ADVIL) 200 MG CAPS Take 400 mg by mouth every 6 (six) hours as needed. Pain.   Yes Historical Provider, MD  metoprolol tartrate (LOPRESSOR) 25 MG tablet Take 0.5 tablets (12.5 mg total) by mouth 2 (two) times daily as needed. Patient not taking: Reported on 09/02/2015 04/23/15   Erlene Quan, PA-C     No Known Allergies     Objective:  Physical Exam  Constitutional: She is oriented to person, place, and time. She appears well-developed and  well-nourished. She is active and cooperative. No distress.  BP 116/72 (BP Location: Right Arm, Patient Position: Sitting, Cuff Size: Small)   Pulse 89   Temp 98.7 F (37.1 C) (Oral)   Resp 18   Ht 5\' 4"  (1.626 m)   Wt 134 lb (60.8 kg)   SpO2 94%   BMI 23.00 kg/m   HENT:  Head: Normocephalic and atraumatic.  Right Ear: Hearing, external ear and ear canal normal. Tympanic membrane is injected, erythematous and bulging. A middle ear effusion (purulent) is present.  Left Ear: Hearing, tympanic membrane, external ear and ear canal normal.  Nose: Mucosal edema present.  Mouth/Throat: Uvula is midline, oropharynx is clear and moist and mucous membranes are normal. No oral lesions. No uvula swelling.  Eyes: Conjunctivae are normal. No scleral icterus.  Neck: Normal range of motion. Neck supple. No thyromegaly present.  Cardiovascular: Normal rate, regular rhythm and normal heart sounds.   Pulses:      Radial pulses are 2+ on the right side, and 2+ on the left side.  Pulmonary/Chest: Effort normal and breath sounds normal.  Lymphadenopathy:       Head (right side): No tonsillar, no preauricular, no posterior auricular and no occipital adenopathy present.       Head (left side): No tonsillar, no preauricular, no posterior auricular and no occipital adenopathy present.    She has no cervical adenopathy.       Right: No supraclavicular adenopathy present.  Left: No supraclavicular adenopathy present.  Neurological: She is alert and oriented to person, place, and time. No sensory deficit.  Skin: Skin is warm, dry and intact. No rash noted. No cyanosis or erythema. Nails show no clubbing.  Psychiatric: She has a normal mood and affect. Her speech is normal and behavior is normal.           Assessment & Plan:   1. Acute suppurative otitis media of right ear without spontaneous rupture of tympanic membrane, recurrence not specified Augmentin. Rest. Hydration. RTC if symptoms  worsen/persist. - amoxicillin-clavulanate (AUGMENTIN) 875-125 MG tablet; Take 1 tablet by mouth 2 (two) times daily.  Dispense: 20 tablet; Refill: 0   Fara Chute, PA-C Physician Assistant-Certified [facility]

## 2015-09-10 ENCOUNTER — Encounter: Payer: Self-pay | Admitting: Physician Assistant

## 2015-09-25 ENCOUNTER — Encounter: Payer: Self-pay | Admitting: Physician Assistant

## 2015-09-29 ENCOUNTER — Telehealth: Payer: Self-pay | Admitting: Emergency Medicine

## 2015-09-29 ENCOUNTER — Encounter: Payer: Self-pay | Admitting: Physician Assistant

## 2015-09-30 ENCOUNTER — Ambulatory Visit (INDEPENDENT_AMBULATORY_CARE_PROVIDER_SITE_OTHER): Payer: BLUE CROSS/BLUE SHIELD | Admitting: Physician Assistant

## 2015-09-30 ENCOUNTER — Encounter: Payer: Self-pay | Admitting: Physician Assistant

## 2015-09-30 VITALS — BP 120/82 | HR 80 | Temp 97.6°F | Resp 17 | Ht 64.0 in | Wt 135.0 lb

## 2015-09-30 DIAGNOSIS — H6981 Other specified disorders of Eustachian tube, right ear: Secondary | ICD-10-CM | POA: Diagnosis not present

## 2015-09-30 MED ORDER — PREDNISONE 20 MG PO TABS: ORAL_TABLET | ORAL | 0 refills | Status: DC

## 2015-09-30 NOTE — Patient Instructions (Signed)
     IF you received an x-ray today, you will receive an invoice from [facility]. Please contact [facility] at [phone] with questions or concerns regarding your invoice.   IF you received labwork today, you will receive an invoice from [facility]. Please contact [facility] at [phone] with questions or concerns regarding your invoice.   Our billing staff will not be able to assist you with questions regarding bills from these companies.  You will be contacted with the lab results as soon as they are available. The fastest way to get your results is to activate your My Chart account. Instructions are located on the last page of this paperwork. If you have not heard from us regarding the results in 2 weeks, please contact this office.      

## 2015-09-30 NOTE — Progress Notes (Signed)
   Patient ID: Stacy Schultz, female    DOB: 1950/10/02, 65 y.o.   MRN: [id number]  PCP: Harrison Mons, PA-C  Subjective:   Chief Complaint  Patient presents with  . Follow-up    Previous ear infection    HPI Presents for evaluation of persistent ear pain following recent ear infection.  I saw her on 8/22 with AOM of the RIGHT ear.  Completed the augmentin for sinusitis and RAOM. Had considerable improvement, but persistent discomfort. Popping, fullness. Occasional pain. No fever, chills. No jaw pain. No external ear pain.   Review of Systems As above.    Patient Active Problem List   Diagnosis Date Noted  . Frequent PVCs 04/23/2015  . Low serum vitamin D   . Prediabetes 05/01/2014  . Nonalcoholic steatohepatitis (NASH) with bridging fibrosis on liver biopsy 04/25/2014  . Elevated LFTs   . Gallstones 11/05/2013  . Hepatic cyst 11/05/2013  . Steatosis of liver 11/05/2013  . Lipoma of neck 11/05/2013  . Transaminitis 05/23/2011  . Hydronephrosis of left kidney 05/23/2011  . HYPERLIPIDEMIA, MIXED 10/13/2006  . PALPITATIONS 10/13/2006  . ABNORMAL ELECTROCARDIOGRAM 10/13/2006     Prior to Admission medications   Medication Sig Start Date End Date Taking? Authorizing Provider  cholecalciferol (VITAMIN D) 1000 UNITS tablet Take 1,000 Units by mouth daily.   Yes Historical Provider, MD  clobetasol (TEMOVATE) 0.05 % external solution APPLY TO AFFECTED AREA UP TO 2 TIMES DAILY AS NEEDED **NOT FACE,GROIN,AXILLA** 02/08/15  Yes Historical Provider, MD  Ibuprofen (ADVIL) 200 MG CAPS Take 400 mg by mouth every 6 (six) hours as needed. Pain.   Yes Historical Provider, MD  metoprolol tartrate (LOPRESSOR) 25 MG tablet Take 0.5 tablets (12.5 mg total) by mouth 2 (two) times daily as needed. Patient not taking: Reported on 09/30/2015 04/23/15   Erlene Quan, PA-C     No Known Allergies     Objective:  Physical Exam  Constitutional: She is oriented to person, place, and time.  She appears well-developed and well-nourished. She is active and cooperative. No distress.  BP 120/82 (BP Location: Left Arm, Patient Position: Sitting, Cuff Size: Normal)   Pulse 80   Temp 97.6 F (36.4 C) (Oral)   Resp 17   Ht 5\' 4"  (1.626 m)   Wt 135 lb (61.2 kg)   SpO2 96%   BMI 23.17 kg/m    HENT:  Head: Normocephalic and atraumatic.  Right Ear: Hearing, tympanic membrane, external ear and ear canal normal.  Left Ear: Hearing, tympanic membrane, external ear and ear canal normal.  Nose: Nose normal.  Mouth/Throat: Uvula is midline, oropharynx is clear and moist and mucous membranes are normal. No oral lesions. Normal dentition. No uvula swelling.  Eyes: Conjunctivae are normal.  Pulmonary/Chest: Effort normal.  Neurological: She is alert and oriented to person, place, and time.  Psychiatric: She has a normal mood and affect. Her speech is normal and behavior is normal.           Assessment & Plan:   1. ETD (eustachian tube dysfunction), right Due to recent AOM. Anticipatory guidance provided. Supportive care. Oral prednisone taper to help speed symptoms resolution. - predniSONE (DELTASONE) 20 MG tablet; Take 3 PO QAM x3days, 2 PO QAM x3days, 1 PO QAM x3days  Dispense: 18 tablet; Refill: 0   Fara Chute, PA-C Physician Assistant-Certified [facility]

## 2015-12-01 DIAGNOSIS — H2513 Age-related nuclear cataract, bilateral: Secondary | ICD-10-CM | POA: Diagnosis not present

## 2015-12-01 DIAGNOSIS — H40013 Open angle with borderline findings, low risk, bilateral: Secondary | ICD-10-CM | POA: Diagnosis not present

## 2015-12-12 HISTORY — PX: CATARACT EXTRACTION, BILATERAL: SHX1313

## 2015-12-24 ENCOUNTER — Encounter: Payer: Self-pay | Admitting: Women's Health

## 2015-12-24 DIAGNOSIS — Z1231 Encounter for screening mammogram for malignant neoplasm of breast: Secondary | ICD-10-CM | POA: Diagnosis not present

## 2015-12-25 DIAGNOSIS — H2512 Age-related nuclear cataract, left eye: Secondary | ICD-10-CM | POA: Diagnosis not present

## 2016-01-01 DIAGNOSIS — H2511 Age-related nuclear cataract, right eye: Secondary | ICD-10-CM | POA: Diagnosis not present

## 2016-01-08 DIAGNOSIS — H2511 Age-related nuclear cataract, right eye: Secondary | ICD-10-CM | POA: Diagnosis not present

## 2016-01-30 ENCOUNTER — Ambulatory Visit (INDEPENDENT_AMBULATORY_CARE_PROVIDER_SITE_OTHER): Payer: BLUE CROSS/BLUE SHIELD | Admitting: Women's Health

## 2016-01-30 ENCOUNTER — Encounter: Payer: Self-pay | Admitting: Women's Health

## 2016-01-30 VITALS — BP 126/74 | Ht 64.0 in | Wt 134.0 lb

## 2016-01-30 DIAGNOSIS — Z01419 Encounter for gynecological examination (general) (routine) without abnormal findings: Secondary | ICD-10-CM

## 2016-01-30 DIAGNOSIS — Z13820 Encounter for screening for osteoporosis: Secondary | ICD-10-CM | POA: Diagnosis not present

## 2016-01-30 NOTE — Progress Notes (Signed)
Stacy Schultz July 07, 1950 [id number]    History:    Presents for annual exam.  Postmenopausal on no HRT/not sexually active. 2005 LAVH with BSO for DUB. Normal Pap and mammogram history. 2009 negative colonoscopy. Has had Pneumovax and T dap but not Zostavax.. Hypertension, [facility] cardiologist manages. Hepatic cysts questionable fatty liver 03/2014 negative liver biopsy. 02/2014 DEXA -1.5 FRAX 9.4%/1.2%.  Past medical history, past surgical history, family history and social history were all reviewed and documented in the EPIC chart. Continues to work full-time at a [employer]. Has 4 living children, one daughter died at age 68  Endocarditis. Involved with children and grandchildren.  ROS:  A ROS was performed and pertinent positives and negatives are included.  Exam:  Vitals:   01/30/16 1027  BP: 126/74  Weight: 134 lb (60.8 kg)  Height: 5\' 4"  (1.626 m)   Body mass index is 23 kg/m.   General appearance:  Normal Thyroid:  Symmetrical, normal in size, without palpable masses or nodularity. Respiratory  Auscultation:  Clear without wheezing or rhonchi Cardiovascular  Auscultation:  Regular rate, without rubs, murmurs or gallops  Edema/varicosities:  Not grossly evident Abdominal  Soft,nontender, without masses, guarding or rebound.  Liver/spleen:  No organomegaly noted  Hernia:  None appreciated  Skin  Inspection:  Grossly normal   Breasts: Examined lying and sitting.     Right: Without masses, retractions, discharge or axillary adenopathy.     Left: Without masses, retractions, discharge or axillary adenopathy. Gentitourinary   Inguinal/mons:  Normal without inguinal adenopathy  External genitalia:  Normal  BUS/Urethra/Skene's glands:  Normal  Vagina:  Normal  Cervix:  Normal  Uterus:   normal in size, shape and contour.  Midline and mobile  Adnexa/parametria:     Rt: Without masses or tenderness.   Lt: Without masses or tenderness.  Anus and perineum: Normal  Digital  rectal exam: Normal sphincter tone without palpated masses or tenderness  Assessment/Plan:  66 y.o. DWF G6 P4, (one daughter died at age 88 ) for annual exam with no complaints.  2005 LAVH with BSO for DUB on no HRT Osteopenia without elevated FRAX.   Hypertension, [facility]-cardiologist manages Primary care manages labs  Plan: Repeat DEXA, report reviewed importance of home safety, fall prevention and weightbearing exercise. SBE's, continue annual mammogram, calcium rich diet, vitamin D 1000 daily encouraged. Instructed to have vitamin D level checked at primary care. UAHuel Cote WHNP, 1:56 PM 01/30/2016

## 2016-01-30 NOTE — Patient Instructions (Signed)

## 2016-01-31 LAB — URINALYSIS W MICROSCOPIC + REFLEX CULTURE
BILIRUBIN URINE: NEGATIVE
Bacteria, UA: NONE SEEN [HPF]
Casts: NONE SEEN [LPF]
Crystals: NONE SEEN [HPF]
GLUCOSE, UA: NEGATIVE
Hgb urine dipstick: NEGATIVE
KETONES UR: NEGATIVE
Leukocytes, UA: NEGATIVE
NITRITE: NEGATIVE
PH: 5 (ref 5.0–8.0)
Protein, ur: NEGATIVE
RBC / HPF: NONE SEEN RBC/HPF (ref <=2)
SPECIFIC GRAVITY, URINE: 1.018 (ref 1.001–1.035)
Squamous Epithelial / LPF: NONE SEEN [HPF] (ref <=5)
WBC UA: NONE SEEN WBC/HPF (ref <=5)
Yeast: NONE SEEN [HPF]

## 2016-02-04 ENCOUNTER — Other Ambulatory Visit: Payer: Self-pay | Admitting: Gynecology

## 2016-02-04 DIAGNOSIS — Z13820 Encounter for screening for osteoporosis: Secondary | ICD-10-CM

## 2016-02-17 ENCOUNTER — Other Ambulatory Visit: Payer: Self-pay | Admitting: Gynecology

## 2016-02-17 ENCOUNTER — Ambulatory Visit (INDEPENDENT_AMBULATORY_CARE_PROVIDER_SITE_OTHER): Payer: BLUE CROSS/BLUE SHIELD

## 2016-02-17 DIAGNOSIS — Z780 Asymptomatic menopausal state: Secondary | ICD-10-CM

## 2016-02-17 DIAGNOSIS — Z13820 Encounter for screening for osteoporosis: Secondary | ICD-10-CM

## 2016-02-17 DIAGNOSIS — M8589 Other specified disorders of bone density and structure, multiple sites: Secondary | ICD-10-CM

## 2016-02-23 ENCOUNTER — Other Ambulatory Visit: Payer: Self-pay | Admitting: *Deleted

## 2016-02-23 DIAGNOSIS — Z1321 Encounter for screening for nutritional disorder: Secondary | ICD-10-CM

## 2016-02-24 ENCOUNTER — Ambulatory Visit (INDEPENDENT_AMBULATORY_CARE_PROVIDER_SITE_OTHER): Payer: BLUE CROSS/BLUE SHIELD | Admitting: Physician Assistant

## 2016-02-24 ENCOUNTER — Encounter: Payer: Self-pay | Admitting: Physician Assistant

## 2016-02-24 VITALS — BP 118/67 | HR 65 | Temp 97.8°F | Resp 16 | Ht 64.0 in | Wt 137.0 lb

## 2016-02-24 DIAGNOSIS — R7303 Prediabetes: Secondary | ICD-10-CM | POA: Diagnosis not present

## 2016-02-24 DIAGNOSIS — E782 Mixed hyperlipidemia: Secondary | ICD-10-CM | POA: Diagnosis not present

## 2016-02-24 DIAGNOSIS — R945 Abnormal results of liver function studies: Principal | ICD-10-CM

## 2016-02-24 DIAGNOSIS — K8020 Calculus of gallbladder without cholecystitis without obstruction: Secondary | ICD-10-CM

## 2016-02-24 DIAGNOSIS — R7989 Other specified abnormal findings of blood chemistry: Secondary | ICD-10-CM | POA: Diagnosis not present

## 2016-02-24 DIAGNOSIS — Z23 Encounter for immunization: Secondary | ICD-10-CM

## 2016-02-24 MED ORDER — ZOSTER VAC RECOMB ADJUVANTED 50 MCG/0.5ML IM SUSR: 50.0000 ug | Freq: Once | INTRAMUSCULAR | 0 refills | Status: AC

## 2016-02-24 NOTE — Patient Instructions (Signed)
     IF you received an x-ray today, you will receive an invoice from [facility]. Please contact [facility] at [phone] with questions or concerns regarding your invoice.   IF you received labwork today, you will receive an invoice from [facility]. Please contact [facility] at [phone] with questions or concerns regarding your invoice.   Our billing staff will not be able to assist you with questions regarding bills from these companies.  You will be contacted with the lab results as soon as they are available. The fastest way to get your results is to activate your My Chart account. Instructions are located on the last page of this paperwork. If you have not heard from us regarding the results in 2 weeks, please contact this office.     

## 2016-02-24 NOTE — Progress Notes (Signed)
Patient ID: Johnnette Litter, female    DOB: [birth year], 66 y.o.   MRN: [id number]  PCP: Harrison Mons, PA-C  Chief Complaint  Patient presents with  . digestion problem    x 4 days/ pt states she feels nausea and tired on and off  . Immunizations    pt unsure about getting flu shot    Subjective:   Presents for labs and episodes of "digestive problems."  She recently saw her GYN who advised her it was time for vitamin D level and to reassess prediabetes. She is NOT fasting today, having eaten a sausage McMuffin, coffee with cream and sugar and several sips of sweet tea. Last glucose, A1C and vitamin D were 01/2015.  Episodes of nausea, in which she awakens feeling "not good." Nausea comes and goes. Present x several years, her friends and family have been urging her to seek evaluation. Most recent episode lasted 4 days and is now resolved, and she feels fine.  Can go months without an episode, which then last 2-4 days, and resolve without treatment. No triggers identified. Avoids spicy foods in general. Does consume greasy foods, though she's tying to be more conscientious of her eating habits. During episodes, she feels queasy, and like she needs to have a BM. The urge becomes urgent and then she has a softer than normal, more malodorous than normal stool. She may have 1-3 of these stools in a day during an episode.  Associated symptoms include reduced appetite, fatigue, sometimes a brief, mild HA. Tried Pepto Bismol once and felt better, but hasn't tried this treatment again. Hasn't tried anything else. "There is nothing to take."  In 2013, she was admitted with a cough, which is where her LFTs were first noted to be elevated. Abdominal US was performed as part of that work up and remarkable for multiple gallstones without biliary obstruction or signs of cholecystitis and echogenic liver likely reflecting steatohepatitis.   LEFT hydronephrosis also seen, without identified case, so  CT of the abdomen and pelvis was performed. That revealed hepatic and LEFT renal cysts, mild RIGHT hydronephrosis with possible UPJ obstruction, calcified bibasilar pleural plaques, cholelithiasis and sigmoid diverticulosis. GI evaluation included US biopsy of the liver, which confirmed steatohepatitis and stage 3 bridging fibrosis. Colonoscopy 2009 revealed diverticulosis of the sigmoid colon. LFTs were normal 01/2015 and 06/2015.   No fever, chills. No muscle or joint aches. No CP, SOB, dizziness.    Review of Systems As above.    Patient Active Problem List   Diagnosis Date Noted  . Frequent PVCs 04/23/2015  . Low serum vitamin D   . Prediabetes 05/01/2014  . Nonalcoholic steatohepatitis (NASH) with bridging fibrosis on liver biopsy 04/25/2014  . Elevated LFTs   . Gallstones 11/05/2013  . Hepatic cyst 11/05/2013  . Steatosis of liver 11/05/2013  . Lipoma of neck 11/05/2013  . Transaminitis 05/23/2011  . Hydronephrosis of left kidney 05/23/2011  . HYPERLIPIDEMIA, MIXED 10/13/2006  . PALPITATIONS 10/13/2006  . ABNORMAL ELECTROCARDIOGRAM 10/13/2006     Prior to Admission medications   Medication Sig Start Date End Date Taking? Authorizing Provider  cholecalciferol (VITAMIN D) 1000 UNITS tablet Take 1,000 Units by mouth daily.   Yes Historical Provider, MD  clobetasol (TEMOVATE) 0.05 % external solution APPLY TO AFFECTED AREA UP TO 2 TIMES DAILY AS NEEDED **NOT FACE,GROIN,AXILLA** 02/08/15  Yes Historical Provider, MD  Ibuprofen (ADVIL) 200 MG CAPS Take 400 mg by mouth every 6 (six) hours as needed.  Pain.   Yes Historical Provider, MD     No Known Allergies     Objective:  Physical Exam  Constitutional: She is oriented to person, place, and time. She appears well-developed and well-nourished. She is active and cooperative. No distress.  BP 118/67   Pulse 65   Temp 97.8 F (36.6 C) (Oral)   Resp 16   Ht 5\' 4"  (1.626 m)   Wt 137 lb (62.1 kg)   SpO2 98%   BMI 23.52  kg/m   HENT:  Head: Normocephalic and atraumatic.  Right Ear: Hearing normal.  Left Ear: Hearing normal.  Eyes: Conjunctivae are normal. No scleral icterus.  Neck: Normal range of motion. Neck supple. No thyromegaly present.  Cardiovascular: Normal rate, regular rhythm and normal heart sounds.   Pulses:      Radial pulses are 2+ on the right side, and 2+ on the left side.  Pulmonary/Chest: Effort normal and breath sounds normal.  Abdominal: Soft. Normal appearance and bowel sounds are normal. She exhibits no distension and no mass. There is no hepatosplenomegaly. There is tenderness in the right upper quadrant, epigastric area and left upper quadrant.  She notes discomfort with palpation of the abdomen in general, and relates that she's had that since her first pregnancy.  Lymphadenopathy:       Head (right side): No tonsillar, no preauricular, no posterior auricular and no occipital adenopathy present.       Head (left side): No tonsillar, no preauricular, no posterior auricular and no occipital adenopathy present.    She has no cervical adenopathy.       Right: No supraclavicular adenopathy present.       Left: No supraclavicular adenopathy present.  Neurological: She is alert and oriented to person, place, and time. No sensory deficit.  Skin: Skin is warm, dry and intact. No rash noted. No cyanosis or erythema. Nails show no clubbing.  Psychiatric: She has a normal mood and affect. Her speech is normal and behavior is normal.           Assessment & Plan:   1. Elevated LFTs Normal on last two checks. Will ask GI for advice regarding follow-up of NASH and fibrosis. - Comprehensive metabolic panel  2. Low serum vitamin D Update level. 31 01/2015, 15 04/2014. - VITAMIN D 25 Hydroxy (Vit-D Deficiency, Fractures)  3. Gallstones She isn't sure that she wants to have surgery, but is interested in consulting a surgeon in advance of developing a GB urgency. - Ambulatory referral to  General Surgery  4. Prediabetes Await labs. Adjust regimen as indicated by results. Continue efforts for healthier eating choices, which may also reduce the episodes of nausea. - Comprehensive metabolic panel - Hemoglobin A1c  5. HYPERLIPIDEMIA, MIXED Await labs. Adjust regimen as indicated by results. - Comprehensive metabolic panel - Lipid panel  6. Need for influenza vaccination - Flu Vaccine QUAD 36+ mos IM  7. Need for pneumococcal vaccination Series is now complete. - Pneumococcal conjugate vaccine 13-valent IM  8. Need for shingles vaccine She will obtain this at her local pharmacy at her convenience. - Zoster Vac Recomb Adjuvanted ([place]) 50 MCG SUSR; Inject 50 mcg into the muscle once.  Dispense: 1 each; Refill: 0 - Care order/instruction:   Fara Chute, PA-C Physician Assistant-Certified [facility] [place]

## 2016-02-25 ENCOUNTER — Telehealth: Payer: Self-pay

## 2016-02-25 DIAGNOSIS — K8020 Calculus of gallbladder without cholecystitis without obstruction: Secondary | ICD-10-CM

## 2016-02-25 LAB — COMPREHENSIVE METABOLIC PANEL
ALBUMIN: 4.3 g/dL (ref 3.6–4.8)
ALT: 17 IU/L (ref 0–32)
AST: 20 IU/L (ref 0–40)
Albumin/Globulin Ratio: 1.5 (ref 1.2–2.2)
Alkaline Phosphatase: 58 IU/L (ref 39–117)
BUN / CREAT RATIO: 11 — AB (ref 12–28)
BUN: 10 mg/dL (ref 8–27)
Bilirubin Total: 0.4 mg/dL (ref 0.0–1.2)
CALCIUM: 9.7 mg/dL (ref 8.7–10.3)
CO2: 27 mmol/L (ref 18–29)
CREATININE: 0.94 mg/dL (ref 0.57–1.00)
Chloride: 101 mmol/L (ref 96–106)
GFR, EST AFRICAN AMERICAN: 73 mL/min/{1.73_m2} (ref >59)
GFR, EST NON AFRICAN AMERICAN: 63 mL/min/{1.73_m2} (ref >59)
GLUCOSE: 84 mg/dL (ref 65–99)
Globulin, Total: 2.9 g/dL (ref 1.5–4.5)
Potassium: 3.7 mmol/L (ref 3.5–5.2)
Sodium: 144 mmol/L (ref 134–144)
TOTAL PROTEIN: 7.2 g/dL (ref 6.0–8.5)

## 2016-02-25 LAB — LIPID PANEL
Chol/HDL Ratio: 3.7 ratio units (ref 0.0–4.4)
Cholesterol, Total: 168 mg/dL (ref 100–199)
HDL: 46 mg/dL (ref >39)
LDL CALC: 89 mg/dL (ref 0–99)
Triglycerides: 163 mg/dL — ABNORMAL HIGH (ref 0–149)
VLDL CHOLESTEROL CAL: 33 mg/dL (ref 5–40)

## 2016-02-25 LAB — HEMOGLOBIN A1C
ESTIMATED AVERAGE GLUCOSE: 123 mg/dL
HEMOGLOBIN A1C: 5.9 % — AB (ref 4.8–5.6)

## 2016-02-25 LAB — VITAMIN D 25 HYDROXY (VIT D DEFICIENCY, FRACTURES): VIT D 25 HYDROXY: 42.3 ng/mL (ref 30.0–100.0)

## 2016-02-25 NOTE — Telephone Encounter (Signed)
Ultrasound ordered. Patient notified by My Chart.

## 2016-02-25 NOTE — Telephone Encounter (Signed)
[facility] called, they require a current [place] in order to schedule pts with gallstones. If we are able to order, I will fax over the results and get the pt scheduled. Thank you!

## 2016-04-23 DIAGNOSIS — H40013 Open angle with borderline findings, low risk, bilateral: Secondary | ICD-10-CM | POA: Diagnosis not present

## 2016-05-26 ENCOUNTER — Encounter: Payer: Self-pay | Admitting: Gynecology

## 2016-10-08 ENCOUNTER — Encounter: Payer: Self-pay | Admitting: Physician Assistant

## 2016-10-08 ENCOUNTER — Ambulatory Visit (INDEPENDENT_AMBULATORY_CARE_PROVIDER_SITE_OTHER): Payer: BLUE CROSS/BLUE SHIELD | Admitting: Physician Assistant

## 2016-10-08 VITALS — BP 120/70 | HR 74 | Temp 98.3°F | Resp 18 | Ht 64.0 in | Wt 142.0 lb

## 2016-10-08 DIAGNOSIS — Z23 Encounter for immunization: Secondary | ICD-10-CM

## 2016-10-08 DIAGNOSIS — R0609 Other forms of dyspnea: Secondary | ICD-10-CM

## 2016-10-08 DIAGNOSIS — M25511 Pain in right shoulder: Secondary | ICD-10-CM | POA: Diagnosis not present

## 2016-10-08 DIAGNOSIS — K224 Dyskinesia of esophagus: Secondary | ICD-10-CM | POA: Diagnosis not present

## 2016-10-08 DIAGNOSIS — M25512 Pain in left shoulder: Secondary | ICD-10-CM

## 2016-10-08 DIAGNOSIS — R1084 Generalized abdominal pain: Secondary | ICD-10-CM | POA: Diagnosis not present

## 2016-10-08 DIAGNOSIS — R197 Diarrhea, unspecified: Secondary | ICD-10-CM

## 2016-10-08 DIAGNOSIS — R0600 Dyspnea, unspecified: Secondary | ICD-10-CM

## 2016-10-08 NOTE — Patient Instructions (Addendum)
Call [facility] to schedule the ultrasound: [phone].    IF you received an x-ray today, you will receive an invoice from [facility]. Please contact [facility] at [phone] with questions or concerns regarding your invoice.   IF you received labwork today, you will receive an invoice from [facility]. Please contact [facility] at [phone] with questions or concerns regarding your invoice.   Our billing staff will not be able to assist you with questions regarding bills from these companies.  You will be contacted with the lab results as soon as they are available. The fastest way to get your results is to activate your My Chart account. Instructions are located on the last page of this paperwork. If you have not heard from [place] regarding the results in 2 weeks, please contact this office.

## 2016-10-08 NOTE — Progress Notes (Signed)
Subjective:    Patient ID: Stacy Schultz, female    DOB: 1950-08-06, 66 y.o.   MRN: [id number]  Shoulder Pain   Pertinent negatives include no fever or numbness.  Shortness of Breath  Associated symptoms include rhinorrhea. Pertinent negatives include no fever, headaches or sore throat.    Patient complains a 4/10 "dull nagging" ache in between the shoulder blades for the past 3 weeks. Advil and heat modalities help. Denies anything that exacerbates the pain. A few times it radiated up the neck and into the head, giving her a headache. She denies any chest pain or palpitations. She works at a desk job. She denies any change in pillow or mattress. She has not started any new exercises.   She also describes feeling "winded" a few times in the past 2 weeks with climbing a flight of stair and carrying a box to the car, which was never a problem for her before. She does have a Grade 1 diastolic dysfunction from her Echo in 05/2015. She denies any lightheadedness, dizziness, syncope, or visual changes.   She has felt sick since Friday, decreased appetite, 2-3x "hot flashes". Also complains of diffuse esophageal spasms, has trouble swallowing liquids and solids at times. When it happens, the food or liquid will get stuck in the upper chest and her mouth starts to water, like she needs to vomit. Then she gets into a "hiccup fit". Increased belching.   Also, notes loose, stringy, mucous stools about once a month without "any rhyme or reason". She states this has been going on for a while.   Review of Systems  Constitutional: Positive for chills and fatigue. Negative for appetite change, diaphoresis and fever.  HENT: Positive for rhinorrhea and sneezing. Negative for congestion, sinus pain, sinus pressure, sore throat and tinnitus.   Respiratory: Positive for shortness of breath.   Endocrine: Positive for heat intolerance.  Neurological: Negative for dizziness, light-headedness, numbness and headaches.        Objective:   Physical Exam  Constitutional: She is oriented to person, place, and time. She appears well-developed and well-nourished. No distress.  HENT:  Head: Normocephalic and atraumatic.  Right Ear: External ear normal.  Left Ear: External ear normal.  Neck: No JVD present. No tracheal deviation present. No thyromegaly present.  Cardiovascular: Normal rate, regular rhythm, normal heart sounds and intact distal pulses.  Exam reveals no gallop and no friction rub.   No murmur heard. Pulmonary/Chest: Effort normal and breath sounds normal. No stridor. No respiratory distress. She has no wheezes. She has no rales. She exhibits no tenderness.  Abdominal: Soft. Bowel sounds are normal. She exhibits no distension and no mass. There is no tenderness. There is no rebound and no guarding.  Lymphadenopathy:    She has no cervical adenopathy.  Neurological: She is alert and oriented to person, place, and time.  Skin: Skin is warm and dry. No rash noted. She is not diaphoretic. No erythema. No pallor.  Psychiatric: She has a normal mood and affect. Her behavior is normal. Judgment and thought content normal.      Assessment & Plan:  1. Generalized abdominal pain - CBC with Differential/Platelet - Comprehensive metabolic panel - Amylase - Lipase - US Abdomen Limited RUQ; Future - Ambulatory referral to Gastroenterology  2. Pain of both shoulder joints  3. Diarrhea, unspecified type - Ambulatory referral to Gastroenterology  4. Dyspnea on exertion - Ambulatory referral to Cardiology  5. Need for influenza vaccination - Flu Vaccine  QUAD 36+ mos IM  6. ABNORMAL ELECTROCARDIOGRAM - Ambulatory referral to Cardiology  7. Diffuse esophageal spasm - Ambulatory referral to Gastroenterology  Respectfully, Denny Levy PA-S 2019

## 2016-10-08 NOTE — Progress Notes (Signed)
Patient ID: Stacy Schultz, female    DOB: [birth year], 66 y.o.   MRN: [id number]  PCP: Harrison Mons, PA-C  Chief Complaint  Patient presents with  . Shoulder Pain    x3-4 weeks pain, on and off, mostly in between the shoulders,pt states sometimes the pain comes up to her neck and the back of her head.  . Nausea    x3-4 weeks, on and off, pt states she overall doesn't feel good and hasn't been sleeping well and feels fatigued  . Shortness of Breath    pt states SOB comes and goes.    Subjective:   Presents for evaluation of several complaints.  I last saw her in February for nausea. Her symptoms were thought likely due to cholelithiasis, but she still has not been for follow-up US ordered. Cholelithiasis had been seen on a previous US, but when she was referred to general surgery, was advised it needed to be updated.  She describes "dull nagging" ache between her shoulder blades x 3 weeks. No identified trigger or inciting activity. Nothing exacerbates the pain, NSAIDS and heat help to alleviate it. The pain sometimes moves up the back of her head and causes HA. No associated chest or jaw pain, nausea, palpitations.   Episodic feeling "winded" x 2 weeks with exertion (climbing a flight of stairs, carrying a box to the car).  Generalized malaise x 1 week, associated with reduced appetite and a few "hot flashes." Has been experiencing "diffuse esophageal spasms" when swallowing solids and liquids, but only sometimes. When this occurs, she feels the need to vomit, develops hiccups and then increased belching.  Once monthly loose stool, described a stringy. Without identified cause. No melena, hematochezia. No urinary changes.  She has known Grade 1 systolic dysfunction noted on echo 05/2015.  Review of Systems As above.    Patient Active Problem List   Diagnosis Date Noted  . Frequent PVCs 04/23/2015  . Low serum vitamin D   . Prediabetes 05/01/2014  . Nonalcoholic  steatohepatitis (NASH) with bridging fibrosis on liver biopsy 04/25/2014  . Elevated LFTs   . Gallstones 11/05/2013  . Hepatic cyst 11/05/2013  . Steatosis of liver 11/05/2013  . Lipoma of neck 11/05/2013  . Transaminitis 05/23/2011  . Hydronephrosis of left kidney 05/23/2011  . HYPERLIPIDEMIA, MIXED 10/13/2006  . PALPITATIONS 10/13/2006  . ABNORMAL ELECTROCARDIOGRAM 10/13/2006     Prior to Admission medications   Medication Sig Start Date End Date Taking? Authorizing Provider  cholecalciferol (VITAMIN D) 1000 UNITS tablet Take 1,000 Units by mouth daily.   Yes [provider]  clobetasol (TEMOVATE) 0.05 % external solution APPLY TO AFFECTED AREA UP TO 2 TIMES DAILY AS NEEDED **NOT FACE,GROIN,AXILLA** 02/08/15  Yes [provider]  Ibuprofen (ADVIL) 200 MG CAPS Take 400 mg by mouth every 6 (six) hours as needed. Pain.   Yes [provider]     No Known Allergies     Objective:  Physical Exam  Constitutional: She is oriented to person, place, and time. She appears well-developed and well-nourished. She is active and cooperative. No distress.  BP 120/70 (BP Location: Right Arm, Patient Position: Sitting, Cuff Size: Normal)   Pulse 74   Temp 98.3 F (36.8 C) (Oral)   Resp 18   Ht 5\' 4"  (1.626 m)   Wt 142 lb (64.4 kg)   SpO2 98%   BMI 24.37 kg/m   HENT:  Head: Normocephalic and atraumatic.  Right Ear: Hearing normal.  Left Ear: Hearing normal.  Eyes: Conjunctivae are normal. No scleral icterus.  Neck: Normal range of motion. Neck supple. No thyromegaly present.  Cardiovascular: Normal rate, regular rhythm and normal heart sounds.   Pulses:      Radial pulses are 2+ on the right side, and 2+ on the left side.  Pulmonary/Chest: Effort normal and breath sounds normal.  Abdominal: Soft. Normal appearance and bowel sounds are normal. There is no hepatosplenomegaly. There is generalized tenderness. There is no CVA tenderness.  Lymphadenopathy:        Head (right side): No tonsillar, no preauricular, no posterior auricular and no occipital adenopathy present.       Head (left side): No tonsillar, no preauricular, no posterior auricular and no occipital adenopathy present.    She has no cervical adenopathy.       Right: No supraclavicular adenopathy present.       Left: No supraclavicular adenopathy present.  Neurological: She is alert and oriented to person, place, and time. No sensory deficit.  Skin: Skin is warm, dry and intact. No rash noted. No cyanosis or erythema. Nails show no clubbing.  Psychiatric: She has a normal mood and affect. Her speech is normal and behavior is normal.           Assessment & Plan:   1. Generalized abdominal pain I suspect that this pain is GERD/cholelithiasis. Update [place] as previously planned. Refer to GI as she also relates esophageal spasm and may need EGD. In addition, colonoscopy was 01/2007. - CBC with Differential/Platelet - Comprehensive metabolic panel - Amylase - Lipase - US Abdomen Limited RUQ; Future - Ambulatory referral to Gastroenterology  2. Pain of both shoulder joints Pain isn't really in the shoulders, rather in the upper back between the shoulder blades and is likely related to her GI complaints.  3. Diarrhea, unspecified type Once monthly. Wouldn't really pursue GI evaluation for this, but she is almost due for repeat colonoscopy. - Ambulatory referral to Gastroenterology  4. Dyspnea on exertion Known grade 1 diastolic dysfunction. Concern for progression. However, this could also be related to her GI complaints. - Ambulatory referral to Cardiology  5. Need for influenza vaccination - Flu Vaccine QUAD 36+ mos IM  6. Diffuse esophageal spasm - Ambulatory referral to Gastroenterology    No Follow-up on file.   Fara Chute, PA-C [facility]

## 2016-10-09 LAB — COMPREHENSIVE METABOLIC PANEL
ALT: 45 IU/L — AB (ref 0–32)
AST: 44 IU/L — ABNORMAL HIGH (ref 0–40)
Albumin/Globulin Ratio: 1.7 (ref 1.2–2.2)
Albumin: 4.7 g/dL (ref 3.6–4.8)
Alkaline Phosphatase: 63 IU/L (ref 39–117)
BUN/Creatinine Ratio: 10 — ABNORMAL LOW (ref 12–28)
BUN: 11 mg/dL (ref 8–27)
Bilirubin Total: 0.4 mg/dL (ref 0.0–1.2)
CALCIUM: 10.2 mg/dL (ref 8.7–10.3)
CO2: 25 mmol/L (ref 20–29)
CREATININE: 1.11 mg/dL — AB (ref 0.57–1.00)
Chloride: 103 mmol/L (ref 96–106)
GFR, EST AFRICAN AMERICAN: 59 mL/min/{1.73_m2} — AB (ref >59)
GFR, EST NON AFRICAN AMERICAN: 52 mL/min/{1.73_m2} — AB (ref >59)
GLUCOSE: 85 mg/dL (ref 65–99)
Globulin, Total: 2.8 g/dL (ref 1.5–4.5)
Potassium: 4.3 mmol/L (ref 3.5–5.2)
Sodium: 144 mmol/L (ref 134–144)
TOTAL PROTEIN: 7.5 g/dL (ref 6.0–8.5)

## 2016-10-09 LAB — CBC WITH DIFFERENTIAL/PLATELET
BASOS ABS: 0 10*3/uL (ref 0.0–0.2)
BASOS: 0 %
EOS (ABSOLUTE): 0.2 10*3/uL (ref 0.0–0.4)
EOS: 2 %
HEMATOCRIT: 43.2 % (ref 34.0–46.6)
HEMOGLOBIN: 14.5 g/dL (ref 11.1–15.9)
Immature Grans (Abs): 0 10*3/uL (ref 0.0–0.1)
Immature Granulocytes: 0 %
LYMPHS ABS: 3.6 10*3/uL — AB (ref 0.7–3.1)
LYMPHS: 42 %
MCH: 28.4 pg (ref 26.6–33.0)
MCHC: 33.6 g/dL (ref 31.5–35.7)
MCV: 85 fL (ref 79–97)
MONOCYTES: 8 %
Monocytes Absolute: 0.7 10*3/uL (ref 0.1–0.9)
NEUTROS ABS: 4.2 10*3/uL (ref 1.4–7.0)
Neutrophils: 48 %
Platelets: 221 10*3/uL (ref 150–379)
RBC: 5.11 x10E6/uL (ref 3.77–5.28)
RDW: 13.3 % (ref 12.3–15.4)
WBC: 8.8 10*3/uL (ref 3.4–10.8)

## 2016-10-09 LAB — LIPASE: LIPASE: 32 U/L (ref 14–72)

## 2016-10-09 LAB — AMYLASE: AMYLASE: 57 U/L (ref 31–124)

## 2016-10-12 ENCOUNTER — Ambulatory Visit: Payer: BLUE CROSS/BLUE SHIELD | Admitting: Physician Assistant

## 2016-10-19 ENCOUNTER — Encounter: Payer: Self-pay | Admitting: Physician Assistant

## 2016-10-20 ENCOUNTER — Ambulatory Visit: Payer: BLUE CROSS/BLUE SHIELD | Admitting: Internal Medicine

## 2016-10-25 ENCOUNTER — Ambulatory Visit
Admission: RE | Admit: 2016-10-25 | Discharge: 2016-10-25 | Disposition: A | Discharge status: Home / Self Care | Payer: BLUE CROSS/BLUE SHIELD | Source: Ambulatory Visit | Attending: Physician Assistant | Admitting: Physician Assistant

## 2016-10-25 ENCOUNTER — Encounter: Payer: Self-pay | Admitting: Physician Assistant

## 2016-10-25 DIAGNOSIS — K8020 Calculus of gallbladder without cholecystitis without obstruction: Secondary | ICD-10-CM | POA: Diagnosis not present

## 2016-10-25 DIAGNOSIS — R1084 Generalized abdominal pain: Secondary | ICD-10-CM

## 2016-11-16 DIAGNOSIS — K8020 Calculus of gallbladder without cholecystitis without obstruction: Secondary | ICD-10-CM | POA: Diagnosis not present

## 2016-12-23 ENCOUNTER — Ambulatory Visit: Payer: Self-pay | Admitting: Hematology

## 2016-12-23 ENCOUNTER — Encounter: Payer: Self-pay | Admitting: Physician Assistant

## 2016-12-23 NOTE — Telephone Encounter (Signed)
Patient states she has been coughing since Monday.  She has been taking OTC medications.  Denies fever, difficulty breathing.  Patient C/O of pain to chest/rib cage after coughing.  Patient states she has runny nose that started with a yellow color but is becoming clear.  Patient encouraged to increase fluid intake, run humidifier and continue OTC medications as prescribed.  Advised if symptoms don't improve over the next few days to call back.  Advised if her breathing gets worse, she develops a fever of 103 or greater, of if her chest pain becomes constant to seek emergency care.  Patient verbalizes understanding. Reason for Disposition . ALSO, mild central chest pain occurs only when coughing  Answer Assessment - Initial Assessment Questions 1. ONSET: "When did the cough begin?"      Began on monday 2. SEVERITY: "How bad is the cough today?"      Worse today, makes chest hurt 3. RESPIRATORY DISTRESS: "Describe your breathing."     Short of breath during cough, and gets winded when doing ADL's 4. FEVER: "Do you have a fever?" If so, ask: "What is your temperature, how was it measured, and when did it start?"     No, but gets hot/then cold 5. HEMOPTYSIS: "Are you coughing up any blood?" If so ask: "How much?" (flecks, streaks, tablespoons, etc.)     no 6. TREATMENT: "What have you done so far to treat the cough?" (e.g., meds, fluids, humidifier)     OTC cough medicine since Tuesday 12/21/16 7. CARDIAC HISTORY: "Do you have any history of heart disease?" (e.g., heart attack, congestive heart failure)     No 8. LUNG HISTORY: "Do you have any history of lung disease?"  (e.g., pulmonary embolus, asthma, emphysema)    no 9. PE RISK FACTORS: "Do you have a history of blood clots?" (or: recent major surgery, recent prolonged travel, bedridden )     no 10. OTHER SYMPTOMS: "Do you have any other symptoms? (e.g., runny nose, wheezing, chest pain)       Runny nose, yellow in color sometimes, clear this  morningChest pain with cough  12. TRAVEL: "Have you traveled out of the country in the last month?" (e.g., travel history, exposures)        no  Protocols used: COUGH - ACUTE NON-PRODUCTIVE-A-AH

## 2016-12-24 ENCOUNTER — Encounter: Payer: Self-pay | Admitting: Urgent Care

## 2016-12-24 ENCOUNTER — Ambulatory Visit: Payer: BLUE CROSS/BLUE SHIELD | Admitting: Urgent Care

## 2016-12-24 ENCOUNTER — Other Ambulatory Visit: Payer: Self-pay

## 2016-12-24 VITALS — BP 112/88 | HR 100 | Temp 97.7°F | Resp 16 | Ht 64.0 in | Wt 139.8 lb

## 2016-12-24 DIAGNOSIS — R0982 Postnasal drip: Secondary | ICD-10-CM | POA: Diagnosis not present

## 2016-12-24 DIAGNOSIS — J069 Acute upper respiratory infection, unspecified: Secondary | ICD-10-CM | POA: Diagnosis not present

## 2016-12-24 DIAGNOSIS — B9789 Other viral agents as the cause of diseases classified elsewhere: Secondary | ICD-10-CM | POA: Diagnosis not present

## 2016-12-24 MED ORDER — PSEUDOEPHEDRINE HCL 60 MG PO TABS: 60.0000 mg | ORAL_TABLET | Freq: Three times a day (TID) | ORAL | 0 refills | Status: DC | PRN

## 2016-12-24 MED ORDER — BENZONATATE 100 MG PO CAPS: 100.0000 mg | ORAL_CAPSULE | Freq: Three times a day (TID) | ORAL | 0 refills | Status: DC | PRN

## 2016-12-24 NOTE — Patient Instructions (Addendum)
For sore throat try using a honey-based tea. Use 3 teaspoons of honey with juice squeezed from half lemon. Place shaved pieces of ginger into 1/2-1 cup of water and warm over stove top. Then mix the ingredients and repeat every 4 hours as needed.     Upper Respiratory Infection, Adult Most upper respiratory infections (URIs) are a viral infection of the air passages leading to the lungs. A URI affects the nose, throat, and upper air passages. The most common type of URI is nasopharyngitis and is typically referred to as "the common cold." URIs run their course and usually go away on their own. Most of the time, a URI does not require medical attention, but sometimes a bacterial infection in the upper airways can follow a viral infection. This is called a secondary infection. Sinus and middle ear infections are common types of secondary upper respiratory infections. Bacterial pneumonia can also complicate a URI. A URI can worsen asthma and chronic obstructive pulmonary disease (COPD). Sometimes, these complications can require emergency medical care and may be life threatening. What are the causes? Almost all URIs are caused by viruses. A virus is a type of germ and can spread from one person to another. What increases the risk? You may be at risk for a URI if:  You smoke.  You have chronic heart or lung disease.  You have a weakened defense (immune) system.  You are very young or very old.  You have nasal allergies or asthma.  You work in crowded or poorly ventilated areas.  You work in health care facilities or schools.  What are the signs or symptoms? Symptoms typically develop 2-3 days after you come in contact with a cold virus. Most viral URIs last 7-10 days. However, viral URIs from the influenza virus (flu virus) can last 14-18 days and are typically more severe. Symptoms may include:  Runny or stuffy (congested) nose.  Sneezing.  Cough.  Sore  throat.  Headache.  Fatigue.  Fever.  Loss of appetite.  Pain in your forehead, behind your eyes, and over your cheekbones (sinus pain).  Muscle aches.  How is this diagnosed? Your health care provider may diagnose a URI by:  Physical exam.  Tests to check that your symptoms are not due to another condition such as: ? Strep throat. ? Sinusitis. ? Pneumonia. ? Asthma.  How is this treated? A URI goes away on its own with time. It cannot be cured with medicines, but medicines may be prescribed or recommended to relieve symptoms. Medicines may help:  Reduce your fever.  Reduce your cough.  Relieve nasal congestion.  Follow these instructions at home:  Take medicines only as directed by your health care provider.  Gargle warm saltwater or take cough drops to comfort your throat as directed by your health care provider.  Use a warm mist humidifier or inhale steam from a shower to increase air moisture. This may make it easier to breathe.  Drink enough fluid to keep your urine clear or pale yellow.  Eat soups and other clear broths and maintain good nutrition.  Rest as needed.  Return to work when your temperature has returned to normal or as your health care provider advises. You may need to stay home longer to avoid infecting others. You can also use a face mask and careful hand washing to prevent spread of the virus.  Increase the usage of your inhaler if you have asthma.  Do not use any tobacco products, including  cigarettes, chewing tobacco, or electronic cigarettes. If you need help quitting, ask your health care provider. How is this prevented? The best way to protect yourself from getting a cold is to practice good hygiene.  Avoid oral or hand contact with people with cold symptoms.  Wash your hands often if contact occurs.  There is no clear evidence that vitamin C, vitamin E, echinacea, or exercise reduces the chance of developing a cold. However, it is  always recommended to get plenty of rest, exercise, and practice good nutrition. Contact a health care provider if:  You are getting worse rather than better.  Your symptoms are not controlled by medicine.  You have chills.  You have worsening shortness of breath.  You have brown or red mucus.  You have yellow or brown nasal discharge.  You have pain in your face, especially when you bend forward.  You have a fever.  You have swollen neck glands.  You have pain while swallowing.  You have white areas in the back of your throat. Get help right away if:  You have severe or persistent: ? Headache. ? Ear pain. ? Sinus pain. ? Chest pain.  You have chronic lung disease and any of the following: ? Wheezing. ? Prolonged cough. ? Coughing up blood. ? A change in your usual mucus.  You have a stiff neck.  You have changes in your: ? Vision. ? Hearing. ? Thinking. ? Mood. This information is not intended to replace advice given to you by your health care provider. Make sure you discuss any questions you have with your health care provider. Document Released: 06/23/2000 Document Revised: 08/31/2015 Document Reviewed: 04/04/2013 Elsevier Interactive Patient Education  2017 [employer].     IF you received an x-ray today, you will receive an invoice from [facility]. Please contact [facility] at [phone] with questions or concerns regarding your invoice.   IF you received labwork today, you will receive an invoice from [facility]. Please contact [facility] at [phone] with questions or concerns regarding your invoice.   Our billing staff will not be able to assist you with questions regarding bills from these companies.  You will be contacted with the lab results as soon as they are available. The fastest way to get your results is to activate your My Chart account. Instructions are located on the last page of this paperwork. If you have not heard  from [place] regarding the results in 2 weeks, please contact this office.

## 2016-12-24 NOTE — Progress Notes (Signed)
  MRN: [id number] DOB: 1950/08/15  Subjective:   Stacy Schultz is a 66 y.o. female presenting for 5 day history of sinus congestion, productive cough, nasal and chest congestion, scratchy throat, post-nasal drainage. Coughing fits elicit some shob. Has tried Advil decongestant, Tussin. Denies sinus pain, ear pain, ear drainage, throat pain, chest pain, wheezing, n/v, abdominal pain, rashes. Denies smoking cigarettes. Denies history of asthma.  Stacy Schultz has a current medication list which includes the following prescription(s): cholecalciferol, clobetasol, and ibuprofen. Also has No Known Allergies.  Stacy Schultz  has a past medical history of Cholelithiasis, Diverticulosis, Elevated LFTs, Hamartoma ([place]), Low serum vitamin D, Prediabetes (05/01/2014), Steatohepatitis (03/28/2014), and Transaminitis (05/23/2011). Also  has a past surgical history that includes Salpingectomy; EXCISION OF LIPOMA; Tonsillectomy; Oophorectomy; Vaginal hysterectomy (03/2003); Tubal ligation; Liver biopsy (2016); and Cataract extraction, bilateral (12/2015).  Objective:   Vitals: BP 112/88   Pulse 100   Temp 97.7 F (36.5 C)   Resp 16   Ht 5\' 4"  (1.626 m)   Wt 139 lb 12.8 oz (63.4 kg)   SpO2 99%   BMI 24.00 kg/m   Physical Exam  Constitutional: She is oriented to person, place, and time. She appears well-developed and well-nourished.  HENT:  TM's intact bilaterally, no effusions or erythema. Nasal turbinates pink and moist, nasal passages patent. No sinus tenderness. Oropharynx clear, mucous membranes moist.  Eyes: Right eye exhibits no discharge. Left eye exhibits no discharge. No scleral icterus.  Neck: Normal range of motion. Neck supple.  Cardiovascular: Normal rate, regular rhythm and intact distal pulses. Exam reveals no gallop and no friction rub.  No murmur heard. Pulmonary/Chest: No respiratory distress. She has no wheezes. She has no rales.  Lymphadenopathy:    She has no cervical adenopathy.  Neurological: She is  alert and oriented to person, place, and time.  Skin: Skin is warm and dry.   Assessment and Plan :   1. Viral URI with cough 2. Post-nasal drainage - Will manage supportively. Return-to-clinic precautions discussed, patient verbalized understanding.    Jaynee Eagles, PA-C [facility] [phone] 12/24/2016  1:29 PM

## 2016-12-28 ENCOUNTER — Encounter: Payer: Self-pay | Admitting: Physician Assistant

## 2016-12-28 DIAGNOSIS — Z1231 Encounter for screening mammogram for malignant neoplasm of breast: Secondary | ICD-10-CM | POA: Diagnosis not present

## 2017-02-10 ENCOUNTER — Encounter: Payer: Self-pay | Admitting: Internal Medicine

## 2017-02-11 ENCOUNTER — Encounter: Payer: Self-pay | Admitting: Internal Medicine

## 2017-04-06 ENCOUNTER — Encounter: Payer: Self-pay | Admitting: Internal Medicine

## 2017-04-06 ENCOUNTER — Other Ambulatory Visit: Payer: Self-pay

## 2017-04-06 ENCOUNTER — Ambulatory Visit (AMBULATORY_SURGERY_CENTER): Payer: Self-pay | Admitting: *Deleted

## 2017-04-06 VITALS — Ht 64.0 in | Wt 142.2 lb

## 2017-04-06 DIAGNOSIS — Z1211 Encounter for screening for malignant neoplasm of colon: Secondary | ICD-10-CM

## 2017-04-06 NOTE — Progress Notes (Signed)
No egg or soy allergy known to patient  Pt. Has had problems with Fentanyl after cataract surgery N&V with past sedation  no intubation problems  No diet pills per patient No home 02 use per patient  No blood thinners per patient  Pt denies issues with constipation  No A fib or A flutter   EMMI video sent to pt's e mail Pt. declined

## 2017-04-18 ENCOUNTER — Encounter: Payer: Self-pay | Admitting: Physician Assistant

## 2017-04-20 ENCOUNTER — Encounter: Payer: Self-pay | Admitting: Internal Medicine

## 2017-04-20 ENCOUNTER — Ambulatory Visit (AMBULATORY_SURGERY_CENTER): Payer: BLUE CROSS/BLUE SHIELD | Admitting: Internal Medicine

## 2017-04-20 ENCOUNTER — Other Ambulatory Visit: Payer: Self-pay

## 2017-04-20 VITALS — BP 126/57 | HR 71 | Temp 99.1°F | Resp 10 | Ht 64.0 in | Wt 142.0 lb

## 2017-04-20 DIAGNOSIS — Z1211 Encounter for screening for malignant neoplasm of colon: Secondary | ICD-10-CM | POA: Diagnosis not present

## 2017-04-20 DIAGNOSIS — Z1212 Encounter for screening for malignant neoplasm of rectum: Secondary | ICD-10-CM | POA: Diagnosis not present

## 2017-04-20 DIAGNOSIS — Z86010 Personal history of colonic polyps: Secondary | ICD-10-CM | POA: Diagnosis not present

## 2017-04-20 MED ORDER — SODIUM CHLORIDE 0.9 % IV SOLN
500.0000 mL | Freq: Once | INTRAVENOUS | Status: DC
Start: 2017-04-20 — End: 2017-11-10

## 2017-04-20 NOTE — Op Note (Signed)
[facility] Patient Name: Stacy Schultz Procedure Date: 04/20/2017 9:20 AM MRN: [id number] Endoscopist: Gatha Mayer , MD Age: 67 Referring MD:  Date of Birth: September 03, 1950 Gender: Female Account #: [id number] Procedure:                Colonoscopy Indications:              Screening for colorectal malignant neoplasm, Last                            colonoscopy: 2009 Medicines:                Propofol per Anesthesia, Monitored Anesthesia Care Procedure:                Pre-Anesthesia Assessment:                           - Prior to the procedure, a History and Physical                            was performed, and patient medications and                            allergies were reviewed. The patient's tolerance of                            previous anesthesia was also reviewed. The risks                            and benefits of the procedure and the sedation                            options and risks were discussed with the patient.                            All questions were answered, and informed consent                            was obtained. Prior Anticoagulants: The patient has                            taken no previous anticoagulant or antiplatelet                            agents. ASA Grade Assessment: II - A patient with                            mild systemic disease. After reviewing the risks                            and benefits, the patient was deemed in                            satisfactory condition to undergo the procedure.  After obtaining informed consent, the colonoscope                            was passed under direct vision. Throughout the                            procedure, the patient's blood pressure, pulse, and                            oxygen saturations were monitored continuously. The                            Colonoscope was introduced through the anus and                            advanced to the the  cecum, identified by                            appendiceal orifice and ileocecal valve. The                            colonoscopy was performed without difficulty. The                            patient tolerated the procedure well. The quality                            of the bowel preparation was good. The ileocecal                            valve, appendiceal orifice, and rectum were                            photographed. The bowel preparation used was                            Miralax. Scope In: 9:37:32 AM Scope Out: 9:48:52 AM Scope Withdrawal Time: 0 hours 7 minutes 45 seconds  Total Procedure Duration: 0 hours 11 minutes 20 seconds  Findings:                 The perianal and digital rectal examinations were                            normal.                           Many diverticula were found in the sigmoid colon.                            There was narrowing of the colon in association                            with the diverticular opening.  Internal hemorrhoids were found.                           The exam was otherwise without abnormality.                           Narrow rectum precluded retroflexion Complications:            No immediate complications. Estimated Blood Loss:     Estimated blood loss: none. Impression:               - Severe diverticulosis in the sigmoid colon. There                            was narrowing of the colon in association with the                            diverticular opening.                           - Internal hemorrhoids.                           - The examination was otherwise normal.                           - No specimens collected. Recommendation:           - Patient has a contact number available for                            emergencies. The signs and symptoms of potential                            delayed complications were discussed with the                            patient. Return to  normal activities tomorrow.                            Written discharge instructions were provided to the                            patient.                           - Resume previous diet.                           - Continue present medications.                           - Repeat colonoscopy in 10 years for screening                            purposes. Gatha Mayer, MD 04/20/2017 9:57:29 AM This report has been signed electronically.

## 2017-04-20 NOTE — Patient Instructions (Addendum)
No polyps or cancer seen today.  You do have diverticulosis - thickened muscle rings and pouches in the colon wall. Please read the handout about this condition.  Consider  routine colonoscopy or other screening test in 10 years - 2029. You will be 76 so may be "aged out" then.  I appreciate the opportunity to care for you. Gatha Mayer, MD, FACG  YOU HAD AN ENDOSCOPIC PROCEDURE TODAY AT [facility]:   Refer to the procedure report that was given to you for any specific questions about what was found during the examination.  If the procedure report does not answer your questions, please call your gastroenterologist to clarify.  If you requested that your care partner not be given the details of your procedure findings, then the procedure report has been included in a sealed envelope for you to review at your convenience later.  YOU SHOULD EXPECT: Some feelings of bloating in the abdomen. Passage of more gas than usual.  Walking can help get rid of the air that was put into your GI tract during the procedure and reduce the bloating. If you had a lower endoscopy (such as a colonoscopy or flexible sigmoidoscopy) you may notice spotting of blood in your stool or on the toilet paper. If you underwent a bowel prep for your procedure, you may not have a normal bowel movement for a few days.  Please Note:  You might notice some irritation and congestion in your nose or some drainage.  This is from the oxygen used during your procedure.  There is no need for concern and it should clear up in a day or so.  SYMPTOMS TO REPORT IMMEDIATELY:   Following lower endoscopy (colonoscopy or flexible sigmoidoscopy):  Excessive amounts of blood in the stool  Significant tenderness or worsening of abdominal pains  Swelling of the abdomen that is new, acute  Fever of 100F or higher   Following upper endoscopy (EGD)  Vomiting of blood or coffee ground material  New chest pain or pain  under the shoulder blades  Painful or persistently difficult swallowing  New shortness of breath  Fever of 100F or higher  Black, tarry-looking stools  For urgent or emergent issues, a gastroenterologist can be reached at any hour by calling [phone].   DIET:  We do recommend a small meal at first, but then you may proceed to your regular diet.  Drink plenty of fluids but you should avoid alcoholic beverages for 24 hours.  ACTIVITY:  You should plan to take it easy for the rest of today and you should NOT DRIVE or use heavy machinery until tomorrow (because of the sedation medicines used during the test).    FOLLOW UP: Our staff will call the number listed on your records the next business day following your procedure to check on you and address any questions or concerns that you may have regarding the information given to you following your procedure. If we do not reach you, we will leave a message.  However, if you are feeling well and you are not experiencing any problems, there is no need to return our call.  We will assume that you have returned to your regular daily activities without incident.  If any biopsies were taken you will be contacted by phone or by letter within the next 1-3 weeks.  Please call us at [phone] if you have not heard about the biopsies in 3 weeks.    SIGNATURES/CONFIDENTIALITY:  You and/or your care partner have signed paperwork which will be entered into your electronic medical record.  These signatures attest to the fact that that the information above on your After Visit Summary has been reviewed and is understood.  Full responsibility of the confidentiality of this discharge information lies with you and/or your care-partner.fo  Diverticulosis and hemorrhoid information given.  Recall 10 years-2029

## 2017-04-20 NOTE — Progress Notes (Signed)
Pt's states no medical or surgical changes since previsit or office visit. 

## 2017-04-20 NOTE — Progress Notes (Signed)
Report given to PACU, vss 

## 2017-04-21 ENCOUNTER — Telehealth: Payer: Self-pay

## 2017-04-21 NOTE — Telephone Encounter (Signed)
  Follow up Call-  Call back number 04/20/2017  Post procedure Call Back phone  # [phone]  Permission to leave phone message Yes  Some recent data might be hidden     Patient questions:  Do you have a fever, pain , or abdominal swelling? No. Pain Score  0 *  Have you tolerated food without any problems? Yes.    Have you been able to return to your normal activities? Yes.    Do you have any questions about your discharge instructions: Diet   No. Medications  No. Follow up visit  No.  Do you have questions or concerns about your Care? No.  Actions: * If pain score is 4 or above: No action needed, pain <4.

## 2017-04-27 DIAGNOSIS — H40013 Open angle with borderline findings, low risk, bilateral: Secondary | ICD-10-CM | POA: Diagnosis not present

## 2017-11-10 ENCOUNTER — Ambulatory Visit: Payer: BLUE CROSS/BLUE SHIELD | Admitting: Physician Assistant

## 2017-11-10 ENCOUNTER — Encounter: Payer: Self-pay | Admitting: Physician Assistant

## 2017-11-10 VITALS — BP 108/66 | HR 72 | Ht 64.0 in | Wt 140.1 lb

## 2017-11-10 DIAGNOSIS — R194 Change in bowel habit: Secondary | ICD-10-CM | POA: Diagnosis not present

## 2017-11-10 DIAGNOSIS — R110 Nausea: Secondary | ICD-10-CM

## 2017-11-10 DIAGNOSIS — R630 Anorexia: Secondary | ICD-10-CM

## 2017-11-10 DIAGNOSIS — R1032 Left lower quadrant pain: Secondary | ICD-10-CM | POA: Diagnosis not present

## 2017-11-10 NOTE — Progress Notes (Signed)
Chief Complaint: Left lower quadrant pain  HPI:    Stacy Schultz is a 67 year old female with a past medical history as listed below, known to Dr. Carlean Purl for nonalcoholic steatohepatitis with bridging fibrosis, who presents to clinic today with a complaint of left lower quadrant pain.    12/03/2014 last office visit at that time following up on the liver biopsy which showed Nash and slight fibrosis.  Recommend she have a recheck of LFTs follow-up in 6 to 12 months.  Recheck labs in 6 months.    10/08/2016 LFTs were minimally elevated at AST 44 and ALT 45.  Others normal.    04/20/2017 colonoscopy with severe diverticulosis in the sigmoid colon, narrowing of the colon in association with a diverticular opening, internal hemorrhoids and otherwise normal exam.  Repeat was recommended in 10 years.    Today, the patient tells me that over the past 2 years she has had episodes where she develops a "lower abdominal upset stomach" she will become severely gassy and bloated with nausea and have some abdominal pain with a change in bowel movements for a few days noting that they are "looser and darker in color and slightly stringy looking".  She never runs a fever and may go months without an issue but typically she will be in so much discomfort that this will make her miss a day or more of work.  Most recently, this occurred over the weekend about 5 days ago.  This time it was even more severe and she felt as though her abdomen hardened especially at the top, which felt like she was having a "contraction".  Tells me that when she would eat or drink anything she felt very full very quickly and had a lot of burping as well as small loose stools.  Tells me this pain worsened throughout the next couple of days and bothered her so much that it hurt to walk around, cough or sneeze.  Patient finally had a fairly good bowel movement this morning which was "back to normal" and today has a decreased degree of pain.  Currently  rated as a 3-4/10, previously a 7-8/10 at its worst.  Tells me she was not eating much but was drinking a lot of ginger ale/sweet tea.    Denies fever, chills, weight loss, vomiting or symptoms that would awaken her from sleep.  Past Medical History:  Diagnosis Date  . Cataract    both eyes     . Cholelithiasis   . Diverticulosis   . Elevated LFTs   . Hamartoma ([place])    [place]  . Low serum vitamin D    improved with prescription treatment to 32  . Prediabetes 05/01/2014   Lab Results Component Value Date  HGBA1C 6.2* 05/23/2011    . Steatohepatitis 03/28/2014   Stage III bridging fibrosis on liver biopsy  . Transaminitis 05/23/2011    Past Surgical History:  Procedure Laterality Date  . CATARACT EXTRACTION, BILATERAL  12/2015  . COLONOSCOPY    . EXCISION OF LIPOMA    . LIVER BIOPSY  2016  . OOPHORECTOMY    . SALPINGECTOMY     RIGHT/FOR ECTOPIC PREGNANCY  . TONSILLECTOMY    . TUBAL LIGATION    . VAGINAL HYSTERECTOMY  03/2003   LAVH, BSO    Current Outpatient Medications  Medication Sig Dispense Refill  . cholecalciferol (VITAMIN D) 1000 UNITS tablet Take 1,000 Units by mouth daily.    . clobetasol (TEMOVATE) 0.05 % external solution  APPLY TO AFFECTED AREA UP TO 2 TIMES DAILY AS NEEDED **NOT FACE,GROIN,AXILLA**  3  . Ibuprofen (ADVIL) 200 MG CAPS Take 400 mg by mouth every 6 (six) hours as needed. Pain.     No current facility-administered medications for this visit.     Allergies as of 11/10/2017 - Review Complete 11/10/2017  Allergen Reaction Noted  . Fentanyl Nausea And Vomiting 12/24/2016    Family History  Problem Relation Age of Onset  . Heart disease Daughter        DIED AT 17Y0 WITH ENDOCARDITIS  . Diabetes Mother   . Hypertension Mother   . Kidney disease Mother   . Cancer Father        Mesothelioma  . Diabetes Sister   . Diabetes Maternal Aunt   . Ovarian cancer Maternal Aunt   . Heart failure Maternal Grandmother        MI  . Hypertension Maternal  Grandmother   . Thyroid disease Maternal Grandmother   . Diabetes Maternal Grandmother   . Stroke Paternal Grandmother   . Stroke Maternal Grandfather   . Arrhythmia Daughter        deceased - V-Fib  . Colon cancer Neg Hx   . Colon polyps Neg Hx   . Gallbladder disease Neg Hx   . Esophageal cancer Neg Hx   . Stomach cancer Neg Hx   . Rectal cancer Neg Hx     Social History   Socioeconomic History  . Marital status: Divorced    Spouse name: n/a  . Number of children: 5  . Years of education: College  . Highest education level: Not on file  Occupational History  . Occupation: Journalist, newspaper: [employer]    Comment: Geologist, engineering  Social Needs  . Financial resource strain: Not on file  . Food insecurity:    Worry: Not on file    Inability: Not on file  . Transportation needs:    Medical: Not on file    Non-medical: Not on file  Tobacco Use  . Smoking status: Never Smoker  . Smokeless tobacco: Never Used  Substance and Sexual Activity  . Alcohol use: No  . Drug use: No  . Sexual activity: Not Currently    Birth control/protection: Surgical  Lifestyle  . Physical activity:    Days per week: Not on file    Minutes per session: Not on file  . Stress: Not on file  Relationships  . Social connections:    Talks on phone: Not on file    Gets together: Not on file    Attends religious service: Not on file    Active member of club or organization: Not on file    Attends meetings of clubs or organizations: Not on file    Relationship status: Not on file  . Intimate partner violence:    Fear of current or ex partner: Not on file    Emotionally abused: Not on file    Physically abused: Not on file    Forced sexual activity: Not on file  Other Topics Concern  . Not on file  Social History Narrative   Lives alone. Sons live nearby. Cared for her mother in her home until the mother's death in 05-03-2012.         epworth sleepiness scale = 7 (04/15/15)    Review of Systems:    Constitutional: No weight loss, fever or chills Cardiovascular: No chest  pain Respiratory: No SOB  Gastrointestinal: See HPI and otherwise negative   Physical Exam:  Vital signs: BP 108/66   Pulse 72   Ht 5\' 4"  (1.626 m)   Wt 140 lb 2 oz (63.6 kg)   BMI 24.05 kg/m   Constitutional:   Pleasant Caucasian female appears to be in NAD, Well developed, Well nourished, alert and cooperative Head:  Normocephalic and atraumatic. Eyes:   PEERL, EOMI. No icterus. Conjunctiva pink. Ears:  Normal auditory acuity. Neck:  Supple Throat: Oral cavity and pharynx without inflammation, swelling or lesion.  Respiratory: Respirations even and unlabored. Lungs clear to auscultation bilaterally.   No wheezes, crackles, or rhonchi.  Cardiovascular: Normal S1, S2. No MRG. Regular rate and rhythm. No peripheral edema, cyanosis or pallor.  Gastrointestinal:  Soft, nondistended, nontender. Mild generalized ttp, some worse in LLQ, Decreased BS all four quadrants, No appreciable masses or hepatomegaly. Rectal:  Not performed.  Msk:  Symmetrical without gross deformities. Without edema, no deformity or joint abnormality.  Neurologic:  Alert and  oriented x4;  grossly normal neurologically.  Skin:   Dry and intact without significant lesions or rashes. Psychiatric: Demonstrates good judgement and reason without abnormal affect or behaviors.  No recent labs.  Assessment: 1.  Generalized abdominal pain: Over the past 4 to 5 days, comes in episodes along with increased bloating, nausea and a change in bowel habits, colonoscopy in April showing stricturing diverticular disease; consider most likely partial bowel obstruction 2.  Nausea 3.  Decrease in appetite: With episodes above 4.  Change in bowel habits: Towards looser darker, more stringy stools  Plan: 1.  Ordered a CT abdomen and pelvis with contrast, this will be completed tomorrow to ensure  there is no inflammation or continued obstruction. 2.  Recommend the patient start taking MiraLAX at least a quarter of a dose on a daily basis to ensure that she is having regular bowel movements. 3.  Encouraged patient stay well-hydrated. 4.  Discussed with patient that if she has another episode of severe pain like this would recommend that she call her clinic.  At that time would recommend a KUB to consider possible bowel obstruction as I feels as though this one may be resolving. 5. Also gave patient strict ER precautions, if she has another bad episode like this one, would recommend she proceed to the ER 6.  Patient to follow in clinic as needed with Dr. Carlean Purl or myself.  Continue to recommend that her PCP monitor her LFTs given her history of nonalcoholic steatohepatitis.  Ellouise Newer, PA-C [facility] 11/10/2017, 9:19 AM

## 2017-11-10 NOTE — Patient Instructions (Signed)
If you are age 69 or older, your body mass index should be between 23-30. Your Body mass index is 24.05 kg/m. If this is out of the aforementioned range listed, please consider follow up with your Primary Care Provider.  If you are age 47 or younger, your body mass index should be between 19-25. Your Body mass index is 24.05 kg/m. If this is out of the aformentioned range listed, please consider follow up with your Primary Care Provider.   Drink lots of liquids today for tomorrow's procedure:  You have been scheduled for a CT scan of the abdomen and pelvis at [facility] ([address]---this is in the same building as Press [facility]).   You are scheduled on Friday, Nov. 1st at 2:15pm. You should arrive 15 minutes prior to your appointment time for registration. Please follow the written instructions below on the day of your exam:  WARNING: IF YOU ARE ALLERGIC TO IODINE/X-RAY DYE, PLEASE NOTIFY RADIOLOGY IMMEDIATELY AT [phone]! YOU WILL BE GIVEN A 13 HOUR PREMEDICATION PREP.  1) Do not eat anything after 10:15am (4 hours prior to your test) 2) You have been given 2 bottles of oral contrast to drink. The solution may taste better if refrigerated, but do NOT add ice or any other liquid to this solution. Shake well before drinking.    Drink 1 bottle of contrast @ 12:15pm (2 hours prior to your exam)  Drink 1 bottle of contrast @ 1:15pm (1 hour prior to your exam)  You may take any medications as prescribed with a small amount of water, if necessary. If you take any of the following medications: METFORMIN, GLUCOPHAGE, GLUCOVANCE, AVANDAMET, RIOMET, FORTAMET, [place] MET, JANUMET, GLUMETZA or METAGLIP, you MAY be asked to HOLD this medication 48 hours AFTER the exam.  The purpose of you drinking the oral contrast is to aid in the visualization of your intestinal tract. The contrast solution may cause some diarrhea. Depending on your individual set of symptoms, you may also  receive an intravenous injection of x-ray contrast/dye. Plan on being at [facility] for 30 minutes or longer, depending on the type of exam you are having performed.  This test typically takes 30-45 minutes to complete.  If you have any questions regarding your exam or if you need to reschedule, you may call the CT department at [phone] between the hours of 8:00 am and 5:00 pm, Monday-Friday.  ________________________________________________________________________    Use Miralax, 1/4 dose daily.  Call if you have another episode and we will order an Xray.  Thank you for choosing [facility] and for entrusting me with your care, Ellouise Newer, PA-C

## 2017-11-11 ENCOUNTER — Ambulatory Visit (INDEPENDENT_AMBULATORY_CARE_PROVIDER_SITE_OTHER)
Admission: RE | Admit: 2017-11-11 | Discharge: 2017-11-11 | Disposition: A | Discharge status: Home / Self Care | Payer: BLUE CROSS/BLUE SHIELD | Source: Ambulatory Visit | Attending: Physician Assistant | Admitting: Physician Assistant

## 2017-11-11 DIAGNOSIS — R1032 Left lower quadrant pain: Secondary | ICD-10-CM | POA: Diagnosis not present

## 2017-11-11 DIAGNOSIS — K8020 Calculus of gallbladder without cholecystitis without obstruction: Secondary | ICD-10-CM | POA: Diagnosis not present

## 2017-11-11 DIAGNOSIS — R110 Nausea: Secondary | ICD-10-CM

## 2017-11-11 MED ORDER — IOPAMIDOL (ISOVUE-300) INJECTION 61%
100.0000 mL | Freq: Once | INTRAVENOUS | Status: AC | PRN
  Administered 2017-11-11: 100 mL via INTRAVENOUS

## 2017-11-15 ENCOUNTER — Telehealth: Payer: Self-pay | Admitting: Internal Medicine

## 2017-11-15 NOTE — Telephone Encounter (Signed)
Pt returned your call and would like a call back at her work, pls ask to speak to her.

## 2017-11-15 NOTE — Telephone Encounter (Signed)
See results note. 

## 2017-12-12 DIAGNOSIS — J069 Acute upper respiratory infection, unspecified: Secondary | ICD-10-CM | POA: Diagnosis not present

## 2017-12-12 DIAGNOSIS — B9789 Other viral agents as the cause of diseases classified elsewhere: Secondary | ICD-10-CM | POA: Diagnosis not present

## 2017-12-29 DIAGNOSIS — Z1231 Encounter for screening mammogram for malignant neoplasm of breast: Secondary | ICD-10-CM | POA: Diagnosis not present

## 2018-07-28 DIAGNOSIS — H43811 Vitreous degeneration, right eye: Secondary | ICD-10-CM | POA: Diagnosis not present

## 2019-01-08 DIAGNOSIS — Z1231 Encounter for screening mammogram for malignant neoplasm of breast: Secondary | ICD-10-CM | POA: Diagnosis not present

## 2019-01-15 ENCOUNTER — Encounter: Payer: Self-pay | Admitting: Women's Health

## 2019-01-17 ENCOUNTER — Encounter: Payer: Self-pay | Admitting: Women's Health

## 2019-01-17 DIAGNOSIS — N6314 Unspecified lump in the right breast, lower inner quadrant: Secondary | ICD-10-CM | POA: Diagnosis not present

## 2019-01-17 DIAGNOSIS — N6313 Unspecified lump in the right breast, lower outer quadrant: Secondary | ICD-10-CM | POA: Diagnosis not present

## 2019-01-22 ENCOUNTER — Other Ambulatory Visit: Payer: Self-pay | Admitting: Radiology

## 2019-01-22 DIAGNOSIS — N6315 Unspecified lump in the right breast, overlapping quadrants: Secondary | ICD-10-CM | POA: Diagnosis not present

## 2019-01-29 ENCOUNTER — Encounter: Payer: Self-pay | Admitting: Women's Health

## 2019-02-05 DIAGNOSIS — L218 Other seborrheic dermatitis: Secondary | ICD-10-CM | POA: Diagnosis not present

## 2019-02-05 DIAGNOSIS — L308 Other specified dermatitis: Secondary | ICD-10-CM | POA: Diagnosis not present

## 2019-02-06 ENCOUNTER — Ambulatory Visit: Payer: BLUE CROSS/BLUE SHIELD

## 2019-02-15 ENCOUNTER — Ambulatory Visit: Payer: Self-pay | Attending: Internal Medicine

## 2019-02-15 DIAGNOSIS — Z23 Encounter for immunization: Secondary | ICD-10-CM | POA: Insufficient documentation

## 2019-02-15 NOTE — Progress Notes (Signed)
   Covid-19 Vaccination Clinic  Name:  Stacy Schultz    MRN: [id number] DOB: Oct 22, 1950  02/15/2019  [name] was observed post Covid-19 immunization for 15 minutes without incidence. She was provided with Vaccine Information Sheet and instruction to access the V-Safe system.   [name] was instructed to call 911 with any severe reactions post vaccine: Marland Kitchen Difficulty breathing  . Swelling of your face and throat  . A fast heartbeat  . A bad rash all over your body  . Dizziness and weakness    Immunizations Administered    Name Date Dose VIS Date Route   Pfizer COVID-19 Vaccine 02/15/2019  3:48 PM 0.3 mL 12/22/2018 Intramuscular   Manufacturer: [place]   Lot: [id number]   [place]: [id number]

## 2019-02-27 ENCOUNTER — Ambulatory Visit: Payer: BLUE CROSS/BLUE SHIELD

## 2019-03-13 ENCOUNTER — Ambulatory Visit: Payer: Self-pay | Attending: Internal Medicine

## 2019-03-13 DIAGNOSIS — Z23 Encounter for immunization: Secondary | ICD-10-CM | POA: Insufficient documentation

## 2019-03-13 NOTE — Progress Notes (Signed)
   Covid-19 Vaccination Clinic  Name:  Stacy Schultz    MRN: [id number] DOB: [birth year]  03/13/2019  [name] was observed post Covid-19 immunization for 15 minutes without incident. She was provided with Vaccine Information Sheet and instruction to access the V-Safe system.   [name] was instructed to call 911 with any severe reactions post vaccine: Marland Kitchen Difficulty breathing  . Swelling of face and throat  . A fast heartbeat  . A bad rash all over body  . Dizziness and weakness   Immunizations Administered    Name Date Dose VIS Date Route   Pfizer COVID-19 Vaccine 03/13/2019  3:43 PM 0.3 mL 12/22/2018 Intramuscular   Manufacturer: [place]   Lot: [id number]   [place]: [id number]

## 2019-07-26 DIAGNOSIS — H40013 Open angle with borderline findings, low risk, bilateral: Secondary | ICD-10-CM | POA: Diagnosis not present

## 2020-01-17 ENCOUNTER — Ambulatory Visit: Payer: Self-pay | Admitting: Obstetrics and Gynecology

## 2020-01-24 DIAGNOSIS — N6081 Other benign mammary dysplasias of right breast: Secondary | ICD-10-CM | POA: Diagnosis not present

## 2020-02-01 DIAGNOSIS — Z1152 Encounter for screening for COVID-19: Secondary | ICD-10-CM | POA: Diagnosis not present

## 2020-04-23 DIAGNOSIS — Z0000 Encounter for general adult medical examination without abnormal findings: Secondary | ICD-10-CM | POA: Diagnosis not present

## 2020-04-23 DIAGNOSIS — Z2820 Immunization not carried out because of patient decision for unspecified reason: Secondary | ICD-10-CM | POA: Diagnosis not present

## 2020-04-23 DIAGNOSIS — E782 Mixed hyperlipidemia: Secondary | ICD-10-CM | POA: Diagnosis not present

## 2020-04-23 DIAGNOSIS — R7303 Prediabetes: Secondary | ICD-10-CM | POA: Diagnosis not present

## 2020-04-23 DIAGNOSIS — E2839 Other primary ovarian failure: Secondary | ICD-10-CM | POA: Diagnosis not present

## 2020-04-23 DIAGNOSIS — K7581 Nonalcoholic steatohepatitis (NASH): Secondary | ICD-10-CM | POA: Diagnosis not present

## 2020-05-04 DIAGNOSIS — M545 Low back pain, unspecified: Secondary | ICD-10-CM | POA: Diagnosis not present

## 2020-05-14 DIAGNOSIS — M5442 Lumbago with sciatica, left side: Secondary | ICD-10-CM | POA: Diagnosis not present

## 2020-05-20 DIAGNOSIS — M5416 Radiculopathy, lumbar region: Secondary | ICD-10-CM | POA: Diagnosis not present

## 2020-05-27 DIAGNOSIS — M545 Low back pain, unspecified: Secondary | ICD-10-CM | POA: Diagnosis not present

## 2020-05-30 DIAGNOSIS — M5416 Radiculopathy, lumbar region: Secondary | ICD-10-CM | POA: Diagnosis not present

## 2020-06-03 DIAGNOSIS — M85851 Other specified disorders of bone density and structure, right thigh: Secondary | ICD-10-CM | POA: Diagnosis not present

## 2020-06-03 DIAGNOSIS — M8589 Other specified disorders of bone density and structure, multiple sites: Secondary | ICD-10-CM | POA: Diagnosis not present

## 2020-06-05 DIAGNOSIS — R7989 Other specified abnormal findings of blood chemistry: Secondary | ICD-10-CM | POA: Diagnosis not present

## 2020-06-11 DIAGNOSIS — M5416 Radiculopathy, lumbar region: Secondary | ICD-10-CM | POA: Diagnosis not present

## 2020-06-24 IMAGING — CT CT ABD-PELV W/ CM
2 of 5 series · 16 of 46 positions shown, 18 images · IV contrast (ISOVUE 300)
Comparison: CT Abdomen and Pelvis 02/26/2014.

CLINICAL DATA: 67-year-old female with abdominal pain and nausea.
Personal history of diverticulitis.

EXAM:
CT ABDOMEN AND PELVIS WITH CONTRAST
TECHNIQUE: Multidetector CT imaging of the abdomen and pelvis was performed
using the standard protocol following bolus administration of
intravenous contrast.
CONTRAST:  100mL 7NCO30-FYY IOPAMIDOL (7NCO30-FYY) INJECTION 61%

[Series 2: abd/pel w · axial · 0.76mm/px · z∈[-556,-166]mm · 13 of 88 slices shown, 15 images]
[im 5/88  soft-tissue]
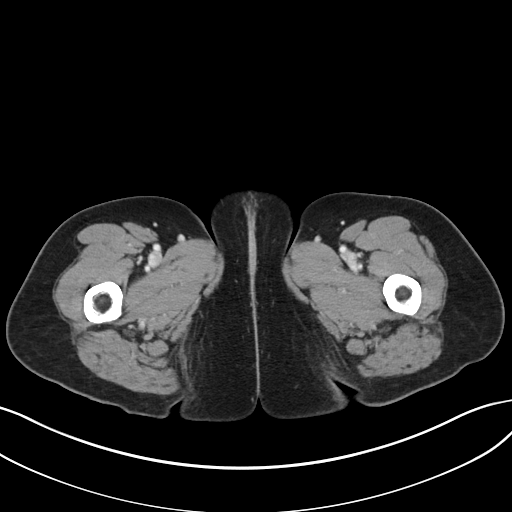
[im 5/88  bone]
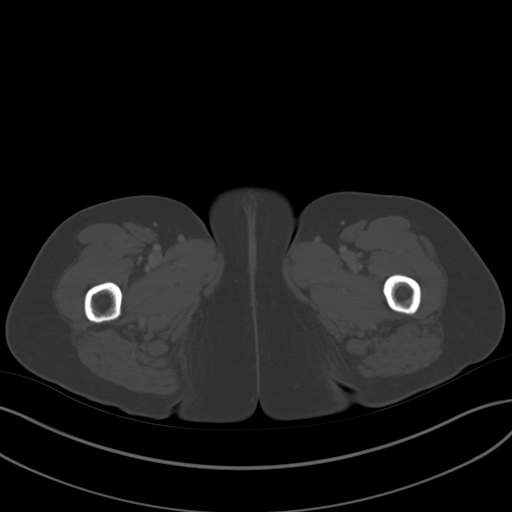
[im 13/88  soft-tissue]
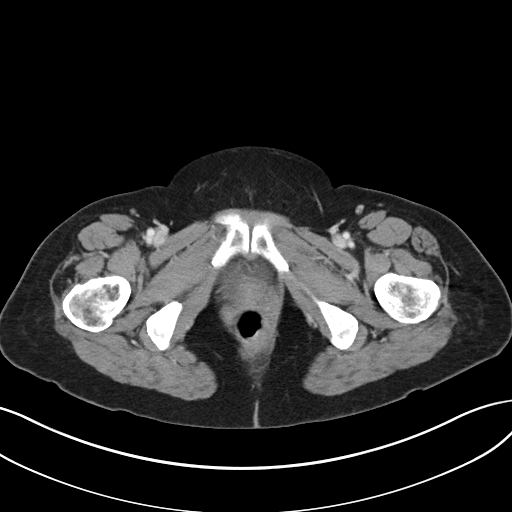
[im 17/88  soft-tissue]
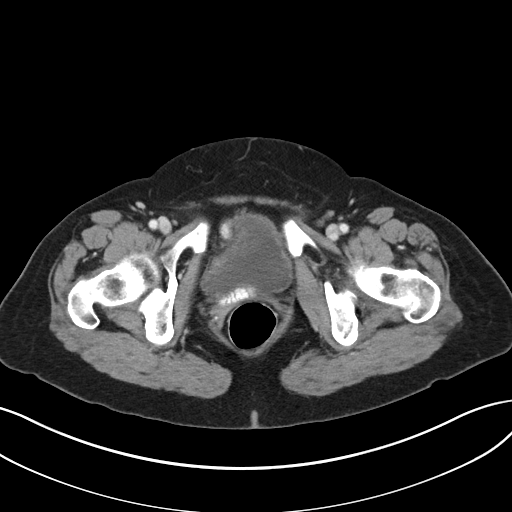
[im 25/88  soft-tissue]
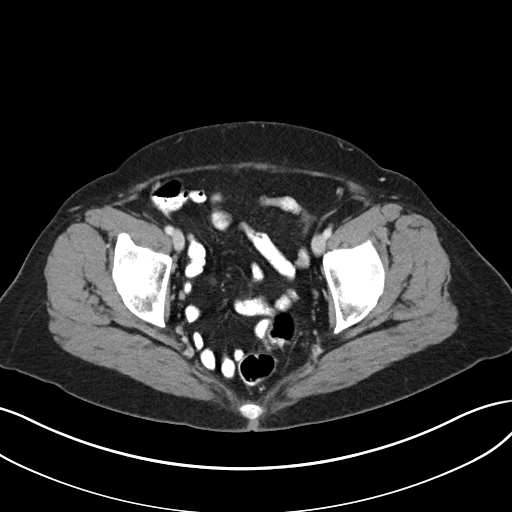
[im 30/88  soft-tissue]
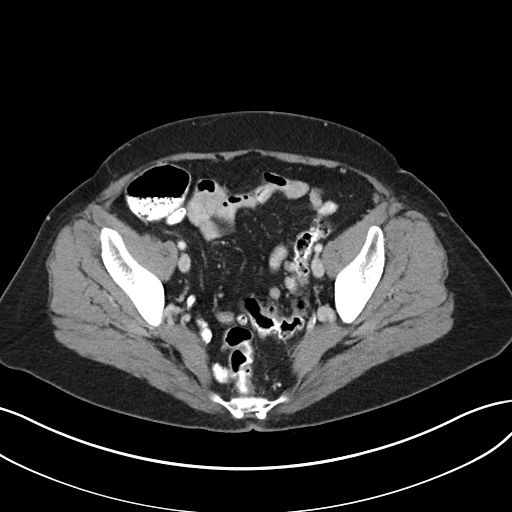
[im 38/88  soft-tissue]
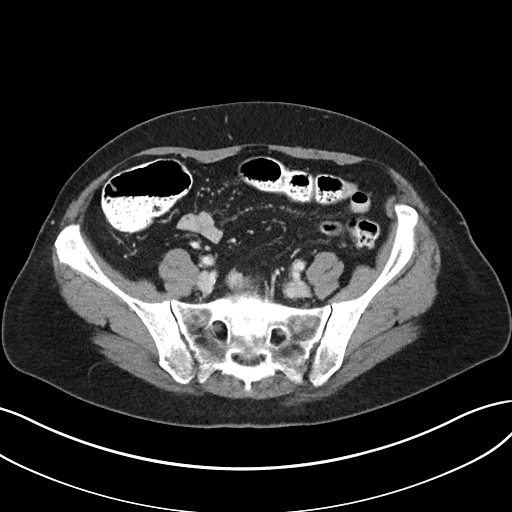
[im 46/88  soft-tissue]
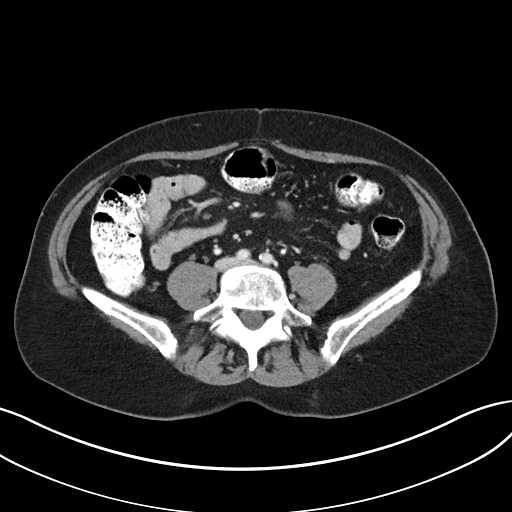
[im 50/88  soft-tissue]
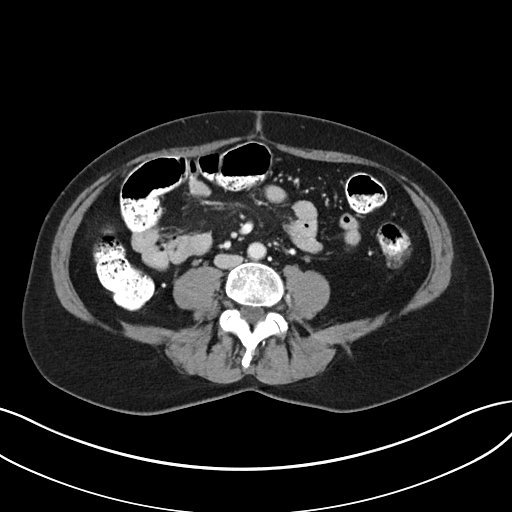
[im 59/88  soft-tissue]
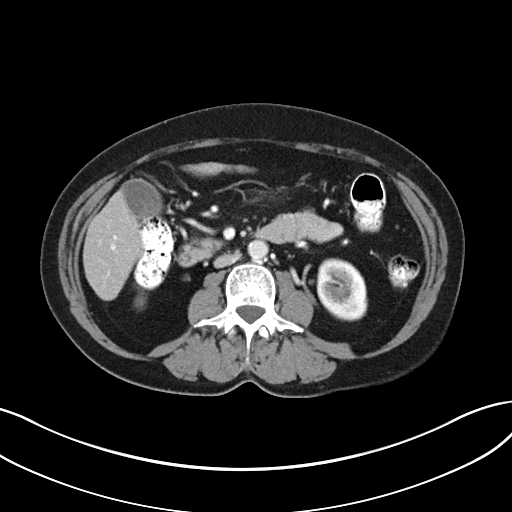
[im 59/88  bone]
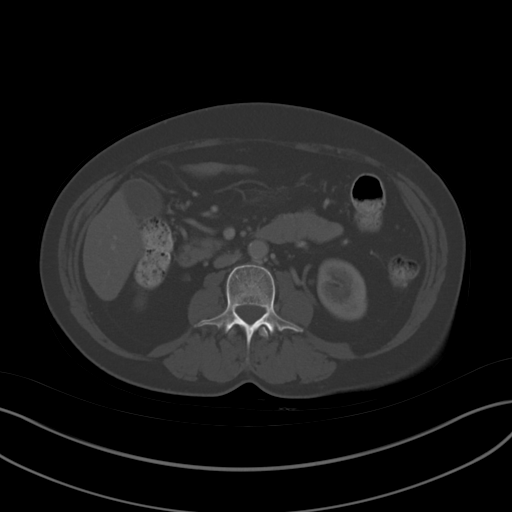
[im 63/88  soft-tissue]
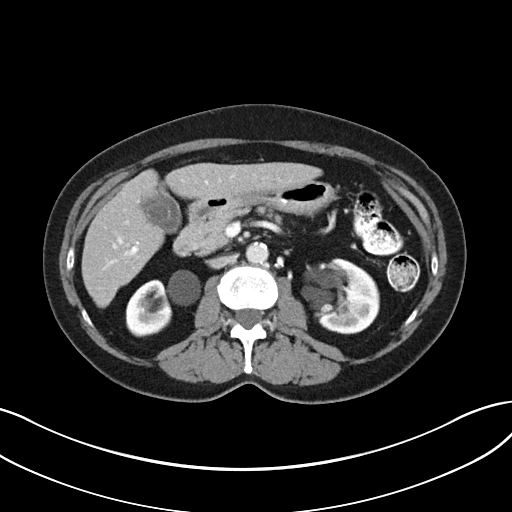
[im 71/88  soft-tissue]
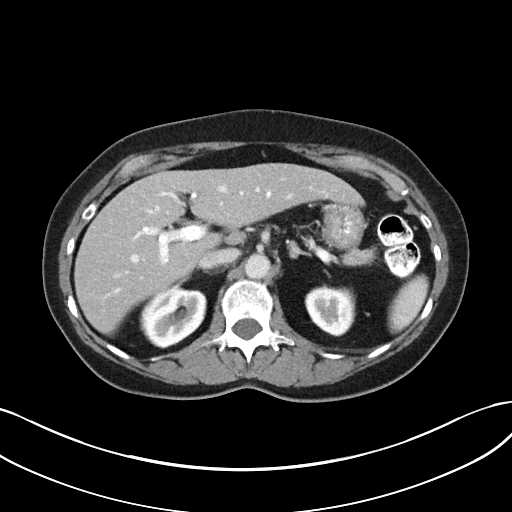
[im 75/88  soft-tissue]
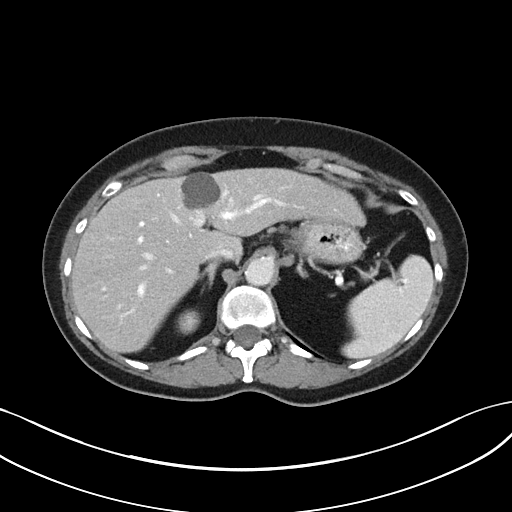
[im 83/88  soft-tissue]
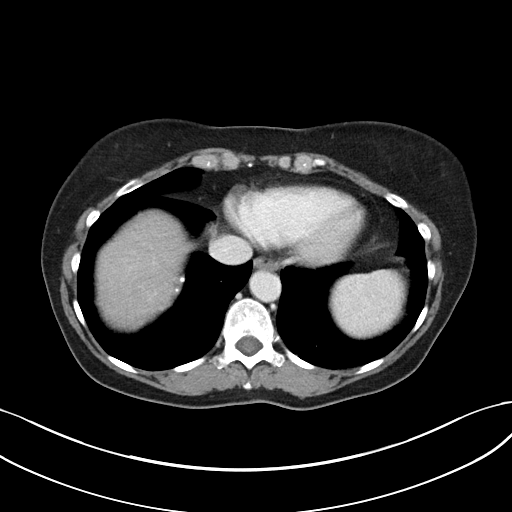

[Series 5: abd/pel w st · coronal · 0.71mm/px · 3 of 88 slices shown]
[im 30/88  soft-tissue]
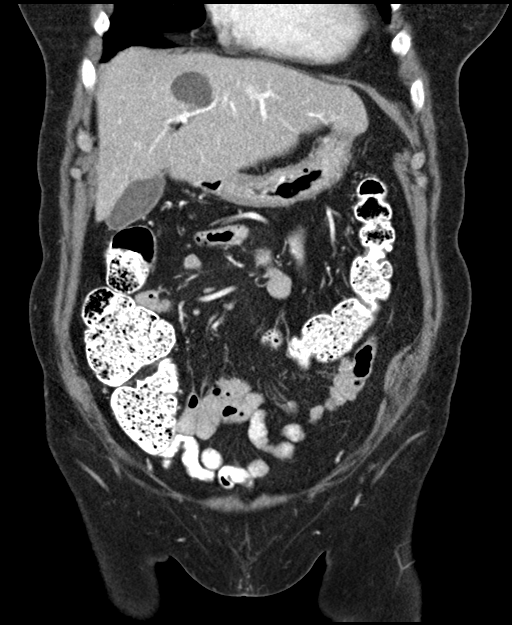
[im 39/88  soft-tissue]
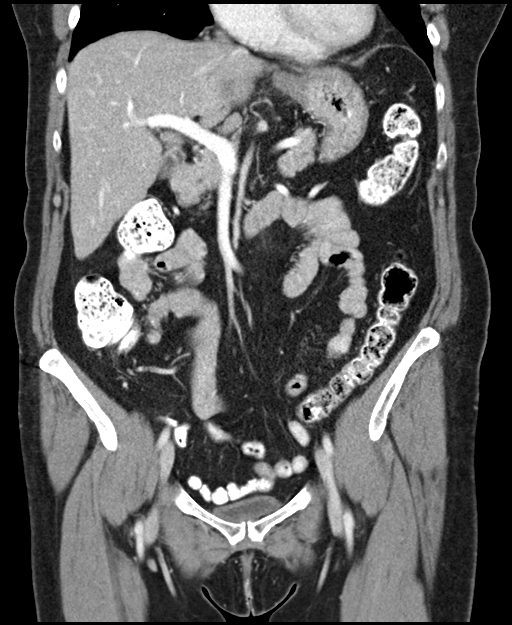
[im 49/88  soft-tissue]
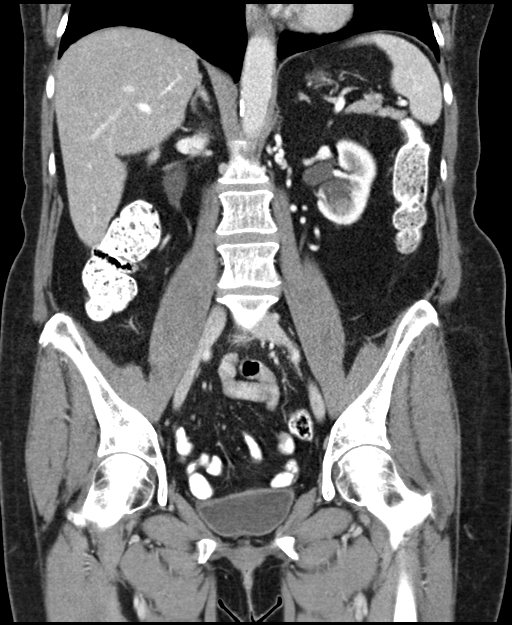

[16 of 46 positions shown; findings below may reference images not displayed]

FINDINGS: Lower chest: Chronic calcified right lower lung pleural plaques are
partially redemonstrated. Otherwise negative lung bases. No
pericardial or pleural effusion.

Hepatobiliary: Stable benign subcapsular liver cyst with simple
fluid density on series 2, image 13. Cholesterol containing
gallstones are estimated to measure 17 up to 17 millimeters
individually. No pericholecystic inflammation. No bile duct
enlargement.

Pancreas: Negative.

Spleen: Negative.

Adrenals/Urinary Tract: Normal adrenal glands.

Benign renal parapelvic cysts. Bilateral renal enhancement and
contrast excretion is symmetric and normal. Negative course of both
ureters.

Diminutive and unremarkable urinary bladder.

Stomach/Bowel: Oral contrast has reached the rectum. Diverticulosis
in the sigmoid colon without active inflammation.

Contrast mixed with retained stool in the more proximal large bowel.
No large bowel wall thickening. Normal appendix (series 2, image
60). Negative terminal ileum.

No dilated or abnormal small bowel loops. Decompressed stomach and
duodenum.

No abdominal free air, no free fluid.

Vascular/Lymphatic: Aortoiliac calcified atherosclerosis. The major
arterial structures are patent. Portal venous system is patent.

No lymphadenopathy.

Reproductive: Surgically absent.

Other: No pelvic free fluid.

Musculoskeletal: Benign vertebral body hemangioma at L2. No acute
osseous abnormality identified.
IMPRESSION: 1. No acute or inflammatory process identified in the abdomen or
pelvis.

2. Cholelithiasis without CT evidence of acute cholecystitis. If
there is right upper quadrant abdominal pain then Ultrasound would
be recommended.
3. Diverticulosis of the sigmoid colon without active inflammation.
Normal appendix.
4. Chronic small calcified pleural plaques which may be due to
asbestos related lung disease.
5.  Aortic Atherosclerosis (5T3C4-57M.M).

## 2020-06-25 DIAGNOSIS — M5416 Radiculopathy, lumbar region: Secondary | ICD-10-CM | POA: Diagnosis not present

## 2020-07-10 DIAGNOSIS — M5416 Radiculopathy, lumbar region: Secondary | ICD-10-CM | POA: Diagnosis not present

## 2020-07-29 DIAGNOSIS — H40013 Open angle with borderline findings, low risk, bilateral: Secondary | ICD-10-CM | POA: Diagnosis not present

## 2020-07-31 ENCOUNTER — Ambulatory Visit: Payer: Self-pay | Admitting: Physician Assistant

## 2020-08-21 ENCOUNTER — Encounter: Payer: Self-pay | Admitting: Nurse Practitioner

## 2020-08-21 ENCOUNTER — Ambulatory Visit: Payer: BC Managed Care – PPO | Admitting: Nurse Practitioner

## 2020-08-21 VITALS — BP 120/70 | HR 72 | Ht 63.25 in | Wt 139.0 lb

## 2020-08-21 DIAGNOSIS — R0989 Other specified symptoms and signs involving the circulatory and respiratory systems: Secondary | ICD-10-CM

## 2020-08-21 DIAGNOSIS — R198 Other specified symptoms and signs involving the digestive system and abdomen: Secondary | ICD-10-CM

## 2020-08-21 DIAGNOSIS — R1310 Dysphagia, unspecified: Secondary | ICD-10-CM

## 2020-08-21 NOTE — Progress Notes (Signed)
08/21/2020 Stacy Schultz [id number] May 31, 1950   Chief Complaint: Difficulty swallowing  History of Present Illness: Stacy Schultz is a 70 year old female with a past medical history of elevated LFTs with nonalcoholic steatohepatitis with stage III bridging fibrosis confirmed by a liver biopsy in 2016 and diverticulosis.  She is followed by Dr. Carlean Purl.She pesents to our office today with complaints of chronic dysphagia.  She describes having food which gets stuck to the upper esophagus, feels as if she has been esophageal spasm and food gets stuck which has occurred intermittently over the past 5 to 6 years.  When food gets stuck she has saliva which builds up and she spits out the saliva and also starts to hiccup.  She vomited out the stuck food on 1 occasion several months ago without recurrence.  Foods such as rice, [place] fries, chicken and doughnuts have briefly gotten stuck in the esophagus.  She also describes having a lump sensation in her throat which causes her to want to swallow more saliva which started after she received her COVID booster the end of May 2022.  No odynophagia.  No heartburn.  She denies ever having an EGD.  Her most recent colonoscopy was 04/20/2017 which showed severe diverticulosis in the sigmoid colon, narrowing of the colon in association with a diverticular opening, internal hemorrhoids and otherwise normal exam.  A repeat colonoscopy in 10 years was recommended.  She has infrequent lower abdominal discomfort which typically occurs if she feels as if her stool has backed up.  She takes a stool softener twice weekly.  She denies having any rectal bleeding or black stools.  Her weight has been stable.  She took Advil in April 2022 due to having back pain for about a week and since then PRN. No other complaints at this time.  History of Stacy Schultz confirmed by liver biopsy in 2016.  Her most recent hepatic panel was done 06/05/2020 which showed AST level 60.  ALT 67.  Total bili  0.3.  Alk phos 69.  Labs done 04/23/2020 showed AST 101 and ALT 83.  GI was previously advised by Dr. Carlean Purl to have her LFTs checked every 6 to 12 months.  Her most recent abdominal imaging was a CTAP with contrast 11/11/2017 which showed a stable benign subscapular liver cyst with simple fluid density and gallstones without biliary ductal dilatation.  Current Outpatient Medications on File Prior to Visit  Medication Sig Dispense Refill   Calcium Carbonate (CALCIUM 600 PO) Take 1 tablet by mouth 3 (three) times daily.     cholecalciferol (VITAMIN D) 1000 UNITS tablet Take 1,000 Units by mouth daily.     Ibuprofen 200 MG CAPS Take 400 mg by mouth as needed. Pain.     No current facility-administered medications on file prior to visit.    Current Medications, Allergies, Past Medical History, Past Surgical History, Family History and Social History were reviewed in [employer] record.   Review of Systems:   Constitutional: Negative for fever, sweats, chills or weight loss.  Respiratory: Negative for shortness of breath.   Cardiovascular: Negative for chest pain, palpitations and leg swelling.  Gastrointestinal: See HPI.  Musculoskeletal: Negative for back pain or muscle aches.  Neurological: Negative for dizziness, headaches or paresthesias.  Nausea and vomiting when Fentanyl used during the time of her cataract surgery and hysterectomy   Physical Exam: BP 120/70 (BP Location: Left Arm, Patient Position: Sitting, Cuff Size: Normal)   Pulse 72  Ht 5' 3.25" (1.607 m) Comment: height measured without shoes  Wt 139 lb (63 kg)   BMI 24.43 kg/m   Wt Readings from Last 3 Encounters:  08/21/20 139 lb (63 kg)  11/10/17 140 lb 2 oz (63.6 kg)  04/20/17 142 lb (64.4 kg)    General: 70 year old female in no acute distress. Head: Normocephalic and atraumatic. Eyes: No scleral icterus. Conjunctiva pink . Ears: Normal auditory acuity. Mouth: Dentition intact. No  ulcers or lesions.  Lungs: Clear throughout to auscultation. Heart: Regular rate and rhythm, no murmur. Abdomen: Soft, nontender and nondistended. No masses or hepatomegaly. Normal bowel sounds x 4 quadrants.  Rectal: Deferred.  Musculoskeletal: Symmetrical with no gross deformities. Extremities: No edema. Neurological: Alert oriented x 4. No focal deficits.  Psychological: Alert and cooperative. Normal mood and affect  Assessment and Recommendations:  Dysphagia, chronic and intermittent  -EGD benefits and risks discussed including risk with sedation, risk of bleeding, perforation and infection  -Prilosec 54m QD as needed  -Consider scheduling a barium swallow study if EGD unrevealing -Patient to call our office if her symptoms worsen  NASH. AST 60 and ALT 67 06/05/2020 -Recommend repeat hepatic panel Q 6 months -I will consult with Dr. GCarlean Purlto verify if an abd sono with elastography to be repeated in the near future   Colon cancer screening.  Last colonoscopy was 04/2017 with recommendations to repeat a colonoscopy 04/2027, she is hoping a repeat colonoscopy will not be necessary at that time

## 2020-08-21 NOTE — Patient Instructions (Signed)
If you are age 72 or older, your body mass index should be between 23-30. Your Body mass index is 24.43 kg/m. If this is out of the aforementioned range listed, please consider follow up with your Primary Care Provider.  The [facility] providers would like to encourage you to use [facility] to communicate with providers for non-urgent requests or questions.  Due to long hold times on the telephone, sending your provider a message by [facility] may be faster and more efficient way to get a response. Please allow 48 business hours for a response.  Please remember that this is for non-urgent requests/questions.  PROCEDURES: You have been scheduled for a EGD. Please follow the written instructions given to you at your visit today. If you use inhalers (even only as needed), please bring them with you on the day of your procedure.  RECOMMENDATIONS: Prilosec over the counter, 20 MG once a day as needed.  It was great seeing you today! Thank you for entrusting me with your care and choosing [facility].  Noralyn Pick, CRNP

## 2020-08-29 ENCOUNTER — Other Ambulatory Visit: Payer: Self-pay

## 2020-08-29 ENCOUNTER — Ambulatory Visit (AMBULATORY_SURGERY_CENTER): Payer: BC Managed Care – PPO | Admitting: Internal Medicine

## 2020-08-29 ENCOUNTER — Encounter: Payer: Self-pay | Admitting: Internal Medicine

## 2020-08-29 VITALS — BP 137/83 | HR 73 | Temp 97.5°F | Resp 12 | Ht 63.0 in | Wt 139.0 lb

## 2020-08-29 DIAGNOSIS — R198 Other specified symptoms and signs involving the digestive system and abdomen: Secondary | ICD-10-CM | POA: Diagnosis not present

## 2020-08-29 DIAGNOSIS — R1310 Dysphagia, unspecified: Secondary | ICD-10-CM

## 2020-08-29 DIAGNOSIS — K222 Esophageal obstruction: Secondary | ICD-10-CM

## 2020-08-29 DIAGNOSIS — R0989 Other specified symptoms and signs involving the circulatory and respiratory systems: Secondary | ICD-10-CM

## 2020-08-29 MED ORDER — SODIUM CHLORIDE 0.9 % IV SOLN: 500.0000 mL | Freq: Once | INTRAVENOUS | Status: DC

## 2020-08-29 NOTE — Op Note (Signed)
[place] Patient Name: Stacy Schultz Procedure Date: 08/29/2020 1:26 PM MRN: [id number] Endoscopist: Gatha Mayer , MD Age: 70 Referring MD:  Date of Birth: [birth year] Gender: Female Account #: [id number] Procedure:                Upper GI endoscopy Indications:              Dysphagia, Globus sensation Medicines:                Propofol per Anesthesia, Monitored Anesthesia Care Procedure:                Pre-Anesthesia Assessment:                           - Prior to the procedure, a History and Physical                            was performed, and patient medications and                            allergies were reviewed. The patient's tolerance of                            previous anesthesia was also reviewed. The risks                            and benefits of the procedure and the sedation                            options and risks were discussed with the patient.                            All questions were answered, and informed consent                            was obtained. Prior Anticoagulants: The patient has                            taken no previous anticoagulant or antiplatelet                            agents. ASA Grade Assessment: I - A normal, healthy                            patient. After reviewing the risks and benefits,                            the patient was deemed in satisfactory condition to                            undergo the procedure.                           After obtaining informed consent, the endoscope was  passed under direct vision. Throughout the                            procedure, the patient's blood pressure, pulse, and                            oxygen saturations were monitored continuously. The                            GIF [id number] was introduced through the                            mouth, and advanced to the second part of duodenum.                            The upper GI  endoscopy was accomplished without                            difficulty. The patient tolerated the procedure                            well. Scope In: Scope Out: Findings:                 One benign-appearing, intrinsic moderate                            (circumferential scarring or stenosis; an endoscope                            may pass) stenosis was found at the                            gastroesophageal junction. The stenosis was                            traversed. The scope was withdrawn. Dilation was                            performed with a Maloney dilator with mild                            resistance at 59 Fr. The dilation site was examined                            following endoscope reinsertion and showed mild                            mucosal disruption. Estimated blood loss was                            minimal.                           The exam was otherwise without abnormality.  The cardia and gastric fundus were normal on                            retroflexion. Complications:            No immediate complications. Estimated Blood Loss:     Estimated blood loss was minimal. Impression:               - Benign-appearing esophageal stenosis. Dilated.                           - The examination was otherwise normal.                           - No specimens collected. Recommendation:           - Patient has a contact number available for                            emergencies. The signs and symptoms of potential                            delayed complications were discussed with the                            patient. Return to normal activities tomorrow.                            Written discharge instructions were provided to the                            patient.                           - Clear liquids x 1 hour then soft foods rest of                            day. Start prior diet tomorrow.                           -  Continue present medications.                           - Office visit me in October Gatha Mayer, MD 08/29/2020 1:43:54 PM This report has been signed electronically.

## 2020-08-29 NOTE — Progress Notes (Signed)
VS- Stacy Schultz. °

## 2020-08-29 NOTE — Progress Notes (Signed)
History and Physical Interval Note:  08/29/2020 1:26 PM  Stacy Schultz  has presented today for endoscopic procedure(s), with the diagnosis of  Encounter Diagnoses  Name Primary?   Dysphagia, unspecified type Yes   Globus sensation   .  The various methods of evaluation and treatment have been discussed with the patient and/or family. After consideration of risks, benefits and other options for treatment, the patient has consented to  the endoscopic procedure(s).   The patient's history has been reviewed, patient examined, no change in status, stable for surgery.  I have reviewed the patient's chart and labs.  Questions were answered to the patient's satisfaction.     Gatha Mayer, MD, [facility]

## 2020-08-29 NOTE — Patient Instructions (Addendum)
The esophagus had a narrow area - a stricture - which I dilated with a special rubber tube.  Let us see if that helps.  I would like to see you back in October to regroup. We can make an appointment for you today.  I appreciate the opportunity to care for you. Gatha Mayer, MD, [facility]  Follow up visit appointment scheduled for October.   YOU HAD AN ENDOSCOPIC PROCEDURE TODAY AT [facility]:   Refer to the procedure report that was given to you for any specific questions about what was found during the examination.  If the procedure report does not answer your questions, please call your gastroenterologist to clarify.  If you requested that your care partner not be given the details of your procedure findings, then the procedure report has been included in a sealed envelope for you to review at your convenience later.  YOU SHOULD EXPECT: Some feelings of bloating in the abdomen. Passage of more gas than usual.  Walking can help get rid of the air that was put into your GI tract during the procedure and reduce the bloating. If you had a lower endoscopy (such as a colonoscopy or flexible sigmoidoscopy) you may notice spotting of blood in your stool or on the toilet paper. If you underwent a bowel prep for your procedure, you may not have a normal bowel movement for a few days.  Please Note:  You might notice some irritation and congestion in your nose or some drainage.  This is from the oxygen used during your procedure.  There is no need for concern and it should clear up in a day or so.  SYMPTOMS TO REPORT IMMEDIATELY:   Following upper endoscopy (EGD)  Vomiting of blood or coffee ground material  New chest pain or pain under the shoulder blades  Painful or persistently difficult swallowing  New shortness of breath  Fever of 100F or higher  Black, tarry-looking stools  For urgent or emergent issues, a gastroenterologist can be reached at any hour by calling [phone]  [phone]. Do not use MyChart messaging for urgent concerns.    DIET:  Clear liquids until 2:30 , soft foods for the rest of today. Tomorrow you may proceed to your regular diet.  Drink plenty of fluids but you should avoid alcoholic beverages for 24 hours.  ACTIVITY:  You should plan to take it easy for the rest of today and you should NOT DRIVE or use heavy machinery until tomorrow (because of the sedation medicines used during the test).    FOLLOW UP: Our staff will call the number listed on your records 48-72 hours following your procedure to check on you and address any questions or concerns that you may have regarding the information given to you following your procedure. If we do not reach you, we will leave a message.  We will attempt to reach you two times.  During this call, we will ask if you have developed any symptoms of COVID 19. If you develop any symptoms (ie: fever, flu-like symptoms, shortness of breath, cough etc.) before then, please call [phone].  If you test positive for Covid 19 in the 2 weeks post procedure, please call and report this information to [place].    If any biopsies were taken you will be contacted by phone or by letter within the next 1-3 weeks.  Please call us at [phone] if you have not heard about the biopsies in 3 weeks.  SIGNATURES/CONFIDENTIALITY: You and/or your care partner have signed paperwork which will be entered into your electronic medical record.  These signatures attest to the fact that that the information above on your After Visit Summary has been reviewed and is understood.  Full responsibility of the confidentiality of this discharge information lies with you and/or your care-partner.

## 2020-08-29 NOTE — Progress Notes (Signed)
Called to room to assist during endoscopic procedure.  Patient ID and intended procedure confirmed with present staff. Received instructions for my participation in the procedure from the performing physician.  

## 2020-08-29 NOTE — Progress Notes (Signed)
1323 Robinul 0.1 mg IV given due large amount of secretions upon assessment.  MD made aware, vss

## 2020-08-29 NOTE — Progress Notes (Signed)
Report given to PACU, vss 

## 2020-09-02 ENCOUNTER — Telehealth: Payer: Self-pay | Admitting: *Deleted

## 2020-09-02 NOTE — Telephone Encounter (Signed)
Left message on f/u call 

## 2020-09-02 NOTE — Telephone Encounter (Signed)
Message left

## 2020-09-17 DIAGNOSIS — H11441 Conjunctival cysts, right eye: Secondary | ICD-10-CM | POA: Diagnosis not present

## 2020-09-17 DIAGNOSIS — H26492 Other secondary cataract, left eye: Secondary | ICD-10-CM | POA: Diagnosis not present

## 2020-09-17 DIAGNOSIS — H40013 Open angle with borderline findings, low risk, bilateral: Secondary | ICD-10-CM | POA: Diagnosis not present

## 2020-09-17 DIAGNOSIS — Z961 Presence of intraocular lens: Secondary | ICD-10-CM | POA: Diagnosis not present

## 2020-09-18 DIAGNOSIS — R7989 Other specified abnormal findings of blood chemistry: Secondary | ICD-10-CM | POA: Diagnosis not present

## 2020-09-30 DIAGNOSIS — H26491 Other secondary cataract, right eye: Secondary | ICD-10-CM | POA: Diagnosis not present

## 2020-11-05 ENCOUNTER — Ambulatory Visit (INDEPENDENT_AMBULATORY_CARE_PROVIDER_SITE_OTHER): Payer: Medicare HMO | Admitting: Internal Medicine

## 2020-11-05 ENCOUNTER — Encounter: Payer: Self-pay | Admitting: Internal Medicine

## 2020-11-05 VITALS — BP 119/77 | HR 77 | Ht 63.0 in | Wt 142.4 lb

## 2020-11-05 DIAGNOSIS — Z9189 Other specified personal risk factors, not elsewhere classified: Secondary | ICD-10-CM | POA: Diagnosis not present

## 2020-11-05 DIAGNOSIS — K222 Esophageal obstruction: Secondary | ICD-10-CM

## 2020-11-05 DIAGNOSIS — E785 Hyperlipidemia, unspecified: Secondary | ICD-10-CM

## 2020-11-05 DIAGNOSIS — K7581 Nonalcoholic steatohepatitis (NASH): Secondary | ICD-10-CM

## 2020-11-05 NOTE — Progress Notes (Signed)
Stacy Schultz 70 y.o. July 13, 1950 [id number]  Assessment & Plan:   Encounter Diagnoses  Name Primary?   Nonalcoholic steatohepatitis (NASH) with bridging fibrosis on liver biopsy Yes   Excessive consumption of soda pop    Esophageal stricture    Dyslipidemia     Her dysphagia is gone fortunately.  Hopefully that will continue.  Should it recur we can consider repeat EGD plus or minus initiation of PPI therapy.  With respect to her Karlene Lineman and fibrosis we knew about that in 2016 and her fibrosis 4 score now is 3.6 which is consistent with F3-4 fibrosis which is what she had in 2016, so this does seem stable.  Fortunately she has not progressed to clinical cirrhosis as best I know.  Regarding treatment with statin she has not taken that it sounds like statin was being prescribed for lipids of the patient thought it was being prescribed for her liver.  Today I have uncovered excessive sugary soda and sweetened beverage use.  Though she drinks less than she used to this is a proximate cause for her Karlene Lineman, as we know fructose is the single major culprit to cause that and is present in large quantities in sodas and other sugary sweetened beverages.  She is advised to reduce and eliminate these beverages from her diet.  She will work on this.  I have given her a handout regarding lower carbohydrate eating and how to eat real food that is not processed which is also appropriate for her Karlene Lineman and metabolic issues.  The focus of this is to try to reduce insulin levels as I believe insulin resistance is a major factor in her metabolic issues and could help lower her triglyceride levels.  The fructose contributes to all of this and is as stated a proximate cause of her Karlene Lineman.  Return in 1 year sooner as needed CC: Harrison Mons, PA     Subjective:   Chief Complaint: Follow-up of dysphagia  HPI Patient returns after she was evaluated and treated for dysphagia and globus sensation with a 76 French  Maloney dilation.  She had a stenotic GE junction area and there was a dilation effect seen after passage of the [facility] dilator.  She reports her symptoms are resolved.  She has not had heartburn or reflux symptoms so PPI has not been started.  We reviewed her abnormal transaminases and Karlene Lineman situation today.  In 2016 she had NASH and bridging fibrosis on liver biopsy after I had seen her for abnormal LFTs.  I looked at her labs today that are in the system and based upon the fib 4 scoring application she remains to have a similar fibrosis picture.  F3-F4.  Fortunately she has not had clinical signs of cirrhosis or decompensation.   She does drink a significant number of sugary sweetened beverages mainly in the form of regular soda Coke is her favorite, daily.  She will also drink some fruit juices.  She says she has reduced over the years but has been a longstanding drinker of these multiple times a day.  Wt Readings from Last 3 Encounters:  11/05/20 142 lb 6.4 oz (64.6 kg)  08/29/20 139 lb (63 kg)  08/21/20 139 lb (63 kg)    Allergies  Allergen Reactions   Fentanyl Nausea And Vomiting   Current Meds  Medication Sig   Calcium Carbonate (CALCIUM 600 PO) Take 1 tablet by mouth 3 (three) times daily. Takes 500 mg   cholecalciferol (VITAMIN D) 1000  UNITS tablet Take 1,000 Units by mouth daily.   Ibuprofen 200 MG CAPS Take 400 mg by mouth as needed. Pain.   Past Medical History:  Diagnosis Date   Blood transfusion without reported diagnosis    Cataract    both eyes      Cholelithiasis    Diverticulosis    Elevated LFTs    [place] ([place])    [place]   Low serum vitamin D    improved with prescription treatment to 32   Osteopenia    Prediabetes 05/01/2014   Lab Results Component Value Date  HGBA1C 6.2* 05/23/2011     Steatohepatitis 2014-04-24   Stage III bridging fibrosis on liver biopsy   Transaminitis 05/23/2011   Past Surgical History:  Procedure Laterality Date   CATARACT  EXTRACTION, BILATERAL  12/2015   COLONOSCOPY     EXCISION OF LIPOMA     LIVER BIOPSY  04/24/2014   OOPHORECTOMY     SALPINGECTOMY     RIGHT/FOR ECTOPIC PREGNANCY   TONSILLECTOMY     TUBAL LIGATION     VAGINAL HYSTERECTOMY  04-24-2003   LAVH, BSO   Social History   Social History Narrative   Lives alone. Sons live nearby. Cared for her mother in her home until the mother's death in Apr 23, 2012.        epworth sleepiness scale = 7 (04/15/15)   family history includes Arrhythmia in her daughter; Cancer in her father; Diabetes in her maternal aunt, maternal grandmother, mother, and sister; Heart disease in her daughter; Heart failure in her maternal grandmother; Hypertension in her maternal grandmother and mother; Kidney disease in her mother; Ovarian cancer in her maternal aunt; Stroke in her maternal grandfather and paternal grandmother; Thyroid disease in her maternal grandmother.   Review of Systems As above  Objective:   Physical Exam BP 119/77   Pulse 77   Ht 5\' 3"  (1.6 m)   Wt 142 lb 6.4 oz (64.6 kg)   SpO2 95%   BMI 25.23 kg/m  She does have mild abdominal adiposity 23 minutes total time spent before during and after this patient visit

## 2020-11-05 NOTE — Patient Instructions (Addendum)
I am glad you are swallowing better.  If those problems return let me know.  As we discussed sugary sweetened beverages are a major problem for you and are the most likely cause of your liver problems.  I suggest that you eliminate those though it will take some time that is in your best interest.  Please read the handout I have provided about other ways to eat to reduce fatty liver and other associated metabolic problems.  I appreciate the opportunity to care for you. Silvano Rusk, MD, [facility]
